# Patient Record
Sex: Female | Born: 1940 | Race: White | Hispanic: No | Marital: Married
Health system: Midwestern US, Academic
[De-identification: ages and names within clinical notes are randomized; demographics above are authoritative.]

---

## 2016-09-18 MED ORDER — SPIRONOLACTONE 25 MG PO TAB
ORAL_TABLET | Freq: Every day | ORAL | 11 refills | 46.00000 days | Status: DC
Start: 2016-09-18 — End: 2017-09-12

## 2016-11-18 ENCOUNTER — Encounter: Admit: 2016-11-18 | Discharge: 2016-11-18 | Payer: MEDICARE | Primary: Family

## 2016-11-18 DIAGNOSIS — I10 Essential (primary) hypertension: ICD-10-CM

## 2016-11-18 DIAGNOSIS — E785 Hyperlipidemia, unspecified: ICD-10-CM

## 2016-11-18 DIAGNOSIS — I447 Left bundle-branch block, unspecified: Principal | ICD-10-CM

## 2016-11-20 MED ORDER — AMLODIPINE 2.5 MG PO TAB
ORAL_TABLET | Freq: Every day | 3 refills | Status: AC
Start: 2016-11-20 — End: 2017-12-05

## 2017-01-15 ENCOUNTER — Encounter: Admit: 2017-01-15 | Discharge: 2017-01-15 | Payer: MEDICARE | Primary: Family

## 2017-01-15 DIAGNOSIS — E785 Hyperlipidemia, unspecified: ICD-10-CM

## 2017-01-15 DIAGNOSIS — I10 Essential (primary) hypertension: ICD-10-CM

## 2017-01-15 DIAGNOSIS — I447 Left bundle-branch block, unspecified: Principal | ICD-10-CM

## 2017-01-15 MED ORDER — ATORVASTATIN 40 MG PO TAB
ORAL_TABLET | Freq: Every day | 3 refills | Status: AC
Start: 2017-01-15 — End: 2018-01-09

## 2017-01-27 ENCOUNTER — Encounter: Admit: 2017-01-27 | Discharge: 2017-01-27 | Payer: MEDICARE | Primary: Family

## 2017-01-30 MED ORDER — POTASSIUM CHLORIDE 10 MEQ PO TBER
ORAL_TABLET | Freq: Every day | 7 refills | 30.00000 days | Status: AC
Start: 2017-01-30 — End: 2017-07-23

## 2017-02-02 ENCOUNTER — Encounter: Admit: 2017-02-02 | Discharge: 2017-02-02 | Payer: MEDICARE | Primary: Family

## 2017-02-02 MED ORDER — HYDROCHLOROTHIAZIDE 50 MG PO TAB
ORAL_TABLET | Freq: Every day | ORAL | 3 refills | 28.00000 days | Status: AC
Start: 2017-02-02 — End: 2018-01-15

## 2017-06-19 ENCOUNTER — Ambulatory Visit: Admit: 2017-06-19 | Discharge: 2017-06-20 | Payer: MEDICARE | Primary: Family

## 2017-06-19 ENCOUNTER — Encounter: Admit: 2017-06-19 | Discharge: 2017-06-19 | Payer: MEDICARE | Primary: Family

## 2017-06-19 DIAGNOSIS — I10 Essential (primary) hypertension: Principal | ICD-10-CM

## 2017-06-19 DIAGNOSIS — I2510 Atherosclerotic heart disease of native coronary artery without angina pectoris: ICD-10-CM

## 2017-06-19 DIAGNOSIS — K8000 Calculus of gallbladder with acute cholecystitis without obstruction: ICD-10-CM

## 2017-06-19 DIAGNOSIS — E079 Disorder of thyroid, unspecified: Principal | ICD-10-CM

## 2017-06-19 DIAGNOSIS — E039 Hypothyroidism, unspecified: ICD-10-CM

## 2017-06-19 DIAGNOSIS — E785 Hyperlipidemia, unspecified: ICD-10-CM

## 2017-06-19 DIAGNOSIS — K922 Gastrointestinal hemorrhage, unspecified: ICD-10-CM

## 2017-06-19 DIAGNOSIS — M4310 Spondylolisthesis, site unspecified: ICD-10-CM

## 2017-06-19 DIAGNOSIS — K219 Gastro-esophageal reflux disease without esophagitis: ICD-10-CM

## 2017-06-19 DIAGNOSIS — M549 Dorsalgia, unspecified: ICD-10-CM

## 2017-07-11 ENCOUNTER — Encounter: Admit: 2017-07-11 | Discharge: 2017-07-11 | Payer: MEDICARE | Primary: Family

## 2017-07-11 DIAGNOSIS — E785 Hyperlipidemia, unspecified: ICD-10-CM

## 2017-07-11 DIAGNOSIS — I10 Essential (primary) hypertension: Principal | ICD-10-CM

## 2017-07-11 LAB — BASIC METABOLIC PANEL
Lab: 137
Lab: 14
Lab: 0.7 /HPF (ref 0–2)
Lab: 101
Lab: 26
Lab: 3.6
Lab: 9.7

## 2017-07-23 ENCOUNTER — Encounter: Admit: 2017-07-23 | Discharge: 2017-07-23 | Payer: MEDICARE | Primary: Family

## 2017-07-23 MED ORDER — POTASSIUM CHLORIDE 10 MEQ PO TBER
ORAL_TABLET | 3 refills | 30.00000 days | Status: AC
Start: 2017-07-23 — End: 2017-07-23

## 2017-07-23 MED ORDER — POTASSIUM CHLORIDE 10 MEQ PO TBER
10 meq | ORAL_TABLET | Freq: Every day | ORAL | 3 refills | 30.00000 days | Status: AC
Start: 2017-07-23 — End: 2018-07-10

## 2017-07-24 ENCOUNTER — Encounter: Admit: 2017-07-24 | Discharge: 2017-07-24 | Payer: MEDICARE | Primary: Family

## 2017-07-24 MED ORDER — LOSARTAN 25 MG PO TAB
ORAL_TABLET | Freq: Every day | ORAL | 3 refills | 30.00000 days | Status: AC
Start: 2017-07-24 — End: 2018-07-10

## 2017-09-12 ENCOUNTER — Encounter: Admit: 2017-09-12 | Discharge: 2017-09-12 | Payer: MEDICARE | Primary: Family

## 2017-09-12 MED ORDER — SPIRONOLACTONE 25 MG PO TAB
ORAL_TABLET | Freq: Every day | ORAL | 11 refills | 46.00000 days | Status: AC
Start: 2017-09-12 — End: 2018-09-02

## 2017-12-05 ENCOUNTER — Encounter: Admit: 2017-12-05 | Discharge: 2017-12-05 | Payer: MEDICARE | Primary: Family

## 2017-12-05 DIAGNOSIS — E785 Hyperlipidemia, unspecified: ICD-10-CM

## 2017-12-05 DIAGNOSIS — I447 Left bundle-branch block, unspecified: Principal | ICD-10-CM

## 2017-12-05 DIAGNOSIS — I10 Essential (primary) hypertension: ICD-10-CM

## 2017-12-05 MED ORDER — AMLODIPINE 2.5 MG PO TAB
ORAL_TABLET | Freq: Every day | 3 refills | Status: AC
Start: 2017-12-05 — End: 2018-03-28

## 2018-01-09 ENCOUNTER — Encounter: Admit: 2018-01-09 | Discharge: 2018-01-09 | Payer: MEDICARE | Primary: Family

## 2018-01-09 DIAGNOSIS — I10 Essential (primary) hypertension: ICD-10-CM

## 2018-01-09 DIAGNOSIS — E785 Hyperlipidemia, unspecified: ICD-10-CM

## 2018-01-09 DIAGNOSIS — I447 Left bundle-branch block, unspecified: Principal | ICD-10-CM

## 2018-01-09 MED ORDER — ATORVASTATIN 40 MG PO TAB
ORAL_TABLET | Freq: Every day | 3 refills | Status: AC
Start: 2018-01-09 — End: 2019-01-06

## 2018-01-15 ENCOUNTER — Encounter: Admit: 2018-01-15 | Discharge: 2018-01-15 | Payer: MEDICARE | Primary: Family

## 2018-01-15 MED ORDER — HYDROCHLOROTHIAZIDE 50 MG PO TAB
ORAL_TABLET | Freq: Every day | ORAL | 1 refills | 28.00000 days | Status: AC
Start: 2018-01-15 — End: 2018-07-10

## 2018-01-23 LAB — LIPID PROFILE
Lab: 13 % (ref 0–2)
Lab: 130 % — ABNORMAL LOW (ref 150–200)
Lab: 3 10*3/uL (ref 1.0–4.8)
Lab: 45 % — ABNORMAL LOW (ref >60)
Lab: 65 % — ABNORMAL LOW (ref >60)
Lab: 76 % (ref 0–5)

## 2018-01-23 LAB — ALT (SGPT): Lab: 14 M/UL (ref 4.0–5.0)

## 2018-02-04 ENCOUNTER — Encounter: Admit: 2018-02-04 | Discharge: 2018-02-04 | Payer: MEDICARE | Primary: Family

## 2018-03-06 ENCOUNTER — Encounter: Admit: 2018-03-06 | Discharge: 2018-03-06 | Payer: MEDICARE | Primary: Family

## 2018-03-07 ENCOUNTER — Encounter: Admit: 2018-03-07 | Discharge: 2018-03-07 | Payer: MEDICARE | Primary: Family

## 2018-03-28 ENCOUNTER — Ambulatory Visit: Admit: 2018-03-28 | Discharge: 2018-03-29 | Payer: MEDICARE | Primary: Family

## 2018-03-28 ENCOUNTER — Encounter: Admit: 2018-03-28 | Discharge: 2018-03-28 | Payer: MEDICARE | Primary: Family

## 2018-03-28 DIAGNOSIS — E785 Hyperlipidemia, unspecified: ICD-10-CM

## 2018-03-28 DIAGNOSIS — E079 Disorder of thyroid, unspecified: Principal | ICD-10-CM

## 2018-03-28 DIAGNOSIS — I2510 Atherosclerotic heart disease of native coronary artery without angina pectoris: ICD-10-CM

## 2018-03-28 DIAGNOSIS — I447 Left bundle-branch block, unspecified: Principal | ICD-10-CM

## 2018-03-28 DIAGNOSIS — M549 Dorsalgia, unspecified: ICD-10-CM

## 2018-03-28 DIAGNOSIS — K8000 Calculus of gallbladder with acute cholecystitis without obstruction: ICD-10-CM

## 2018-03-28 DIAGNOSIS — I10 Essential (primary) hypertension: Secondary | ICD-10-CM

## 2018-03-28 DIAGNOSIS — K219 Gastro-esophageal reflux disease without esophagitis: ICD-10-CM

## 2018-03-28 DIAGNOSIS — K922 Gastrointestinal hemorrhage, unspecified: ICD-10-CM

## 2018-03-28 DIAGNOSIS — E039 Hypothyroidism, unspecified: ICD-10-CM

## 2018-03-28 DIAGNOSIS — M4310 Spondylolisthesis, site unspecified: ICD-10-CM

## 2018-03-28 MED ORDER — AMLODIPINE 2.5 MG PO TAB
2.5 mg | ORAL_TABLET | Freq: Two times a day (BID) | ORAL | 3 refills | Status: AC
Start: 2018-03-28 — End: 2018-03-29

## 2018-03-29 ENCOUNTER — Encounter: Admit: 2018-03-29 | Discharge: 2018-03-29 | Payer: MEDICARE | Primary: Family

## 2018-03-29 DIAGNOSIS — I10 Essential (primary) hypertension: ICD-10-CM

## 2018-03-29 DIAGNOSIS — E785 Hyperlipidemia, unspecified: ICD-10-CM

## 2018-03-29 DIAGNOSIS — I447 Left bundle-branch block, unspecified: Principal | ICD-10-CM

## 2018-03-29 MED ORDER — AMLODIPINE 2.5 MG PO TAB
2.5 mg | ORAL_TABLET | Freq: Two times a day (BID) | ORAL | 3 refills | Status: AC
Start: 2018-03-29 — End: 2019-03-27

## 2018-07-10 ENCOUNTER — Encounter: Admit: 2018-07-10 | Discharge: 2018-07-10 | Payer: MEDICARE | Primary: Family

## 2018-07-10 MED ORDER — HYDROCHLOROTHIAZIDE 50 MG PO TAB
ORAL_TABLET | Freq: Every day | ORAL | 3 refills | 28.00000 days | Status: AC
Start: 2018-07-10 — End: 2019-07-07

## 2018-07-10 MED ORDER — LOSARTAN 25 MG PO TAB
ORAL_TABLET | Freq: Every day | ORAL | 3 refills | 90.00000 days | Status: AC
Start: 2018-07-10 — End: 2019-06-23

## 2018-07-10 MED ORDER — POTASSIUM CHLORIDE 10 MEQ PO TBER
ORAL_TABLET | Freq: Every day | 3 refills | 30.00000 days | Status: AC
Start: 2018-07-10 — End: 2019-07-11

## 2018-08-09 ENCOUNTER — Encounter: Admit: 2018-08-09 | Discharge: 2018-08-09 | Payer: MEDICARE | Primary: Family

## 2018-08-29 ENCOUNTER — Encounter: Admit: 2018-08-29 | Discharge: 2018-08-29 | Payer: MEDICARE | Primary: Family

## 2018-09-01 ENCOUNTER — Encounter: Admit: 2018-09-01 | Discharge: 2018-09-01 | Payer: MEDICARE | Primary: Family

## 2018-09-02 MED ORDER — SPIRONOLACTONE 25 MG PO TAB
ORAL_TABLET | Freq: Every day | ORAL | 11 refills | 90.00000 days | Status: DC
Start: 2018-09-02 — End: 2019-09-05

## 2018-12-17 ENCOUNTER — Encounter: Admit: 2018-12-17 | Discharge: 2018-12-17 | Primary: Family

## 2018-12-17 ENCOUNTER — Ambulatory Visit: Admit: 2018-12-17 | Discharge: 2018-12-18 | Primary: Family

## 2018-12-17 DIAGNOSIS — E079 Disorder of thyroid, unspecified: Secondary | ICD-10-CM

## 2018-12-17 DIAGNOSIS — K219 Gastro-esophageal reflux disease without esophagitis: Secondary | ICD-10-CM

## 2018-12-17 DIAGNOSIS — I10 Essential (primary) hypertension: Secondary | ICD-10-CM

## 2018-12-17 DIAGNOSIS — E039 Hypothyroidism, unspecified: Secondary | ICD-10-CM

## 2018-12-17 DIAGNOSIS — E785 Hyperlipidemia, unspecified: Secondary | ICD-10-CM

## 2018-12-17 DIAGNOSIS — M549 Dorsalgia, unspecified: Secondary | ICD-10-CM

## 2018-12-17 DIAGNOSIS — I2510 Atherosclerotic heart disease of native coronary artery without angina pectoris: Secondary | ICD-10-CM

## 2018-12-17 DIAGNOSIS — M4310 Spondylolisthesis, site unspecified: Secondary | ICD-10-CM

## 2018-12-17 DIAGNOSIS — K922 Gastrointestinal hemorrhage, unspecified: Secondary | ICD-10-CM

## 2018-12-17 DIAGNOSIS — K8000 Calculus of gallbladder with acute cholecystitis without obstruction: Secondary | ICD-10-CM

## 2018-12-17 NOTE — Progress Notes
Date of Service: 12/17/2018    Erika Payne is a 78 y.o. female.       HPI     [name] is followed for hypertension and hyperlipidemia and for chronic left bundle branch block on her ECG.??????  Her blood pressures recently been very well controlled with the addition of spironolactone to her medical regimen.  She reports no infectious contacts or infectious symptoms. Otherwise, over the past 6???months???she has been stable from a cardiovascular perspective. The patient reports no angina, congestive symptoms, palpitations, sensation of sustained forceful heart pounding, lightheadedness or syncope. Her exercise tolerance has been stable.?????????She performs video exercises 3 days a week for 30 minutes and rides her stationary bicycle for 20 minutes twice a week. The patient reports no myalgias, bleeding abnormalities, focal neurologic motor abnormalities or difficulty with speech. She reports no hospitalizations or emergency room visits.  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  ???  Histroically, Erika Payne was admitted on 03/15/12 following a 2 day history of weakness, abdominal discomfort, nausea, and dyspnea. She was hypotensive in the ED, passed a melanotic stool, and was found to be anemic. During stay at [facility], received a total of 3 units of blood. She was transferred to [facility] and EGD was performed on 03/18/12 revealing minimal fresh blood in (the duodenal) bulb. A circumferential ulcer at D1/D2 junction with mild narrowing of the lumen. The ulcer was carefully examined and was found to be clean base with no high risk stigmata for recurrent bleeding were seen. No endoscopic intervention was performed. Erika Payne was placed on pantoprazole twice daily and was discharged on 03/24/12.??? She fell down the stairs on 07/25/14 and was hospitalized on the trauma service at [facility] from 07/25/14 through 07/28/14 where she was monitored for a subdural hemorrhage and hematoma. Intervention was not required and she recovered well.       Vitals:    12/17/18 0822 12/17/18 0832   BP: 128/76 126/74   BP Source: Arm, Left Upper Arm, Right Upper   Pulse: 68    Temp: 36.8 ???C (98.2 ???F)    SpO2: 97%    Weight: 70.8 kg (156 lb)    Height: 1.676 m (5' 6)    PainSc: Zero      Body mass index is 25.18 kg/m???.     Past Medical History  Patient Active Problem List    Diagnosis Date Noted   ??? SDH (subdural hematoma) (HCC) 07/26/2014   ??? Fall (on) (from) other stairs and steps, initial encounter 07/26/2014   ??? Syncope 07/25/2014   ??? Left bundle branch block 03/28/2012   ??? Hypokalemia 03/20/2012   ??? 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 Acute blood loss anemia 03/20/2012   ??? Metabolic acidosis 03/20/2012   ??? GI bleed 03/17/2012   ??? Observation for suspected coronary artery disease (CAD) 11/04/2008     Cardiac surveillance testing           a. 11/02 - Exercise echo doppler: EF 60%, trace-mild MR and TR, >95mm ST               segment depression in the infero-lateral leads, that became upsloping within               a minute into recovery     ??? Hypertension 11/04/2008     a. 11/97-Echo:EF 60%, mild LVH, diastolic dysfunction     ???  Hyperlipidemia 11/04/2008     Hypercholesterolemia           a. 10/01-Total 158, Trig 127, HDL 51, LDL 82-Lipitor 20mg      ??? IBS (irritable bowel syndrome) 11/04/2008   ??? 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 Dizziness 11/04/2008         Review of Systems   Constitution: Negative.   HENT: Negative.    Eyes: Negative.    Cardiovascular: Negative.    Respiratory: Negative.    Endocrine: Negative.    Hematologic/Lymphatic: Bruises/bleeds easily.   Skin: Negative.    Musculoskeletal: Positive for arthritis and back pain.   Gastrointestinal: Negative.    Genitourinary: Negative.    Neurological: Negative.    Psychiatric/Behavioral: Negative. Allergic/Immunologic: Negative.        Physical Exam  HEENT: No abnormalities of the visible oro-nasopharynx, conjunctiva or sclera are noted. ???  NECK: There is no jugular venous distension. Carotids are palpable and without bruits. There is no thyroid enlargement. ???  Chest: Lung fields are clear to auscultation. There are no wheezes or crackles. ???  CV: There is a regular rhythm. The first and second heart sounds are normal. A grade 2-3/6 systolic ejection murmur is heard best with the patient supine.There are no diastolic murmurs, gallops or rubs. Her apical heart rate is 68???BPM.  ABD: The abdomen is soft and supple with normal bowel sounds. There is no hepatosplenomegaly, ascites, tenderness, masses or bruits. ???  Neuro: There are no focal motor defects. Ambulation is normal. Cognitive function appears normal. ???  Ext:???There is no edema or evidence of deep vein thrombosis. Peripheral pulses are satisfactory. ???  SKIN:  A rash is noted mostly on her extremities.    PSYCH:???The patient is calm, rationale and oriented.    Cardiovascular Studies  A twelve-lead ECG was obtained on 03/28/2018 reveals normal sinus rhythm with a heart rate of 55 bpm.  Left bundle branch block is noted.  Echo Doppler 06/10/2012:  Technically limted study  Overall LV systolic function is normal, estimated LV EF 50-55%  LV basal septal hypertrophy  Abnormal septal motion consistent with left bundle branch block   Normal chamber dimensions  Aortic sclerosis without aortic stenosis  Calcified aortic root adjacent to left atrium  Mild mitral annular calcification  Mild mitral and trace tricuspid and pulmonic regurgitation  Tricuspid regurgitation peak velocity not well defined and right ventricular peak systolic pressure may be underestimated. The estimated right ventricular peak systolic pressure is similar to prior study 06/12/2011.      Problems Addressed Today  Essential hypertension.  Left bundle branch block.  Hypercholesterolemia. Assessment and Plan     Erika Payne currently appears quite stable from a cardiovascular perspective.  Her blood pressure appears to be under very good control.  I have asked her to obtain a Chem-7 to check her potassium and renal function since she is taken spironolactone. I have asked the patient to keep a log book of her BP readings and to report systolic BP readings exceeding 140 mm Hg.  Regular mild aerobic exercise, weight loss and adherence to a heart healthy diet were recommended. I have asked her to return for follow-up in 6 months.         Current Medications (including today's revisions)  ??? amLODIPine (NORVASC) 2.5 mg tablet Take one tablet by mouth twice daily.   ??? aspirin EC 81 mg tablet Take 1 Tab by mouth  daily.   ??? atorvastatin (LIPITOR) 40 mg tablet TAKE 1 TABLET BY MOUTH ONCE DAILY   ??? citalopram (CELEXA) 10 mg tablet Take 1 tablet by mouth daily.   ??? hydroCHLOROthiazide (HYDRODIURIL) 50 mg tablet TAKE 1/2 (ONE-HALF) TABLET BY MOUTH ONCE DAILY   ??? L.acid/L.casei/B.bif/B.lon/FOS (PROBIOTIC BLEND PO) Take  by mouth.   ??? levothyroxine (SYNTHROID) 125 mcg tablet Take 125 mcg by mouth daily.   ??? loratadine (CLARITIN) 10 mg tablet Take 10 mg by mouth every morning.   ??? losartan (COZAAR) 25 mg tablet TAKE 1 TABLET BY MOUTH ONCE DAILY   ??? LUMIGAN 0.01 % drop INSTILL 1 DROP INTO EACH EYE EVERY DAY AT BEDTIME   ??? pantoprazole DR (PROTONIX) 40 mg tablet Take 40 mg by mouth daily.   ??? potassium chloride (K-DUR) 10 mEq tablet TAKE 1 TABLET BY MOUTH ONCE DAILY WITH MEAL AND  FULL  GLASS  OF  WATER   ??? spironolactone (ALDACTONE) 25 mg tablet Take 1 tablet by mouth once daily with food   ??? temazepam (RESTORIL) 15 mg capsule Take 15-30 mg by mouth at bedtime as needed.   ??? traMADol/acetaminophen(+) (ULTRACET) 37.5/325 mg tablet Take 1-2 Tabs by mouth every 4 hours as needed for Pain.   ??? VIT C/VIT E AC/LUT/COPPER/ZINC (PRESERVISION LUTEIN PO) Take 2 Tabs by mouth.

## 2018-12-30 ENCOUNTER — Encounter: Admit: 2018-12-30 | Discharge: 2018-12-30 | Primary: Family

## 2019-01-04 ENCOUNTER — Encounter: Admit: 2019-01-04 | Discharge: 2019-01-04 | Primary: Family

## 2019-01-04 DIAGNOSIS — I447 Left bundle-branch block, unspecified: Secondary | ICD-10-CM

## 2019-01-04 DIAGNOSIS — E785 Hyperlipidemia, unspecified: Secondary | ICD-10-CM

## 2019-01-04 DIAGNOSIS — I10 Essential (primary) hypertension: Secondary | ICD-10-CM

## 2019-01-06 MED ORDER — ATORVASTATIN 40 MG PO TAB
ORAL_TABLET | Freq: Every day | 3 refills | Status: DC
Start: 2019-01-06 — End: 2019-12-29

## 2019-03-27 ENCOUNTER — Encounter: Admit: 2019-03-27 | Discharge: 2019-03-27 | Payer: MEDICARE | Primary: Family

## 2019-03-27 DIAGNOSIS — E785 Hyperlipidemia, unspecified: Secondary | ICD-10-CM

## 2019-03-27 DIAGNOSIS — I447 Left bundle-branch block, unspecified: Secondary | ICD-10-CM

## 2019-03-27 DIAGNOSIS — I10 Essential (primary) hypertension: Secondary | ICD-10-CM

## 2019-03-27 MED ORDER — AMLODIPINE 2.5 MG PO TAB
ORAL_TABLET | Freq: Two times a day (BID) | 3 refills | Status: AC
Start: 2019-03-27 — End: ?

## 2019-06-17 ENCOUNTER — Encounter: Admit: 2019-06-17 | Discharge: 2019-06-17 | Payer: MEDICARE | Primary: Family

## 2019-06-17 DIAGNOSIS — I2510 Atherosclerotic heart disease of native coronary artery without angina pectoris: Secondary | ICD-10-CM

## 2019-06-17 DIAGNOSIS — E785 Hyperlipidemia, unspecified: Secondary | ICD-10-CM

## 2019-06-17 DIAGNOSIS — M4310 Spondylolisthesis, site unspecified: Secondary | ICD-10-CM

## 2019-06-17 DIAGNOSIS — I447 Left bundle-branch block, unspecified: Secondary | ICD-10-CM

## 2019-06-17 DIAGNOSIS — M549 Dorsalgia, unspecified: Secondary | ICD-10-CM

## 2019-06-17 DIAGNOSIS — E039 Hypothyroidism, unspecified: Secondary | ICD-10-CM

## 2019-06-17 DIAGNOSIS — K8000 Calculus of gallbladder with acute cholecystitis without obstruction: Secondary | ICD-10-CM

## 2019-06-17 DIAGNOSIS — K219 Gastro-esophageal reflux disease without esophagitis: Secondary | ICD-10-CM

## 2019-06-17 DIAGNOSIS — K922 Gastrointestinal hemorrhage, unspecified: Secondary | ICD-10-CM

## 2019-06-17 DIAGNOSIS — I10 Essential (primary) hypertension: Secondary | ICD-10-CM

## 2019-06-17 DIAGNOSIS — E079 Disorder of thyroid, unspecified: Secondary | ICD-10-CM

## 2019-06-17 LAB — BASIC METABOLIC PANEL
Lab: 0.8
Lab: 11
Lab: 140
Lab: 15 — ABNORMAL HIGH (ref 0–14)
Lab: 28
Lab: 3.8
Lab: 68
Lab: 9.4
Lab: 98

## 2019-06-17 NOTE — Progress Notes
Date of Service: 06/17/2019    Erika Payne is a 79 y.o. female.       HPI     Erika Payne is followed for hypertension and hyperlipidemia and for chronic left bundle branch block on her ECG.????Her blood pressures recently been very well controlled with the addition of spironolactone to her medical regimen.  She reports no infectious contacts or infectious symptoms during the Covid pandemic.?Otherwise, over the past 6?months?she has been stable from a cardiovascular perspective. The patient reports no angina, congestive symptoms, palpitations, sensation of sustained forceful heart pounding, lightheadedness or syncope. Her exercise tolerance has been stable.???She performs video exercise ([employer]) every day  30 minutes and rides her stationary bicycle occasionally for exercise. The patient reports no myalgias, bleeding abnormalities, focal neurologic motor abnormalities or difficulty with speech. She reports no hospitalizations or emergency room visits. 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  ? Histroically, Erika. Ahlgren was admitted on 03/15/12 following a 2 day history of weakness, abdominal discomfort, nausea, and dyspnea. She was hypotensive in the ED, passed a melanotic stool, and was found to be anemic. During stay at [facility], received a total of 3 units of blood. She was transferred to [facility] and EGD was performed on 03/18/12 revealing minimal fresh blood in (the duodenal) bulb. A circumferential ulcer at D1/D2 junction with mild narrowing of the lumen. The ulcer was carefully examined and was found to be clean base with no high risk stigmata for recurrent bleeding were seen. No endoscopic intervention was performed. Erika. Segers was placed on pantoprazole twice daily and was discharged on 03/24/12.? She fell down the stairs on 07/25/14 and was hospitalized on the trauma service at [facility] from 07/25/14 through 07/28/14 where she was monitored for a subdural hemorrhage and hematoma. Intervention was not required and she recovered well.       Vitals:    06/17/19 1255 06/17/19 1308   BP: 132/70 126/70   BP Source: Arm, Left Upper Arm, Right Upper   Patient Position: Sitting Sitting   Pulse: 65    Temp: 36.8 ?C (98.3 ?F)    TempSrc: Oral    SpO2: 98%    Weight: 70.3 kg (155 lb)    Height: 1.676 m (5' 6)    PainSc: Zero      Body mass index is 25.02 kg/m?[name]     Past Medical History  Patient Active Problem List    Diagnosis Date Noted   ? SDH (subdural hematoma) (HCC) 07/26/2014   ? Fall (on) (from) other stairs and steps, initial encounter 07/26/2014   ? Syncope 07/25/2014   ? Left bundle branch block 03/28/2012   ? Hypokalemia 03/20/2012   ? 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 Acute blood loss anemia 03/20/2012   ? Metabolic acidosis 03/20/2012   ? GI bleed 03/17/2012   ? Observation for suspected coronary artery disease (CAD) 11/04/2008     Cardiac surveillance testing           a. 11/02 - Exercise echo doppler: EF 60%, trace-mild MR and TR, >25mm ST segment depression in the infero-lateral leads, that became upsloping within               a minute into recovery     ? Hypertension 11/04/2008     a. 11/97-Echo:EF 60%, mild LVH, diastolic dysfunction     ? Hyperlipidemia 11/04/2008  Hypercholesterolemia           a. 10/01-Total 158, Trig 127, HDL 51, LDL 82-Lipitor 20mg      ? IBS (irritable bowel syndrome) 11/04/2008   ? 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 Dizziness 11/04/2008         Review of Systems   Constitution: Negative.   HENT: Negative.    Eyes: Negative.    Cardiovascular: Negative.    Respiratory: Negative.    Endocrine: Negative.    Hematologic/Lymphatic: Negative.    Skin: Negative.    Musculoskeletal: Negative.    Gastrointestinal: Negative.    Genitourinary: Negative.    Neurological: Negative.    Psychiatric/Behavioral: Negative.    Allergic/Immunologic: Negative.        Physical Exam  HEENT: No abnormalities of the visible oro-nasopharynx, conjunctiva or sclera are noted. ?  NECK: There is no jugular venous distension. Carotids are palpable and without bruits. There is no thyroid enlargement. ?  Chest: Lung fields are clear to auscultation. There are no wheezes or crackles. ?  CV: There is a regular rhythm. The first and second heart sounds are normal. A grade 2-3/6 systolic ejection murmur is heard best with the patient supine.There are no diastolic murmurs, gallops or rubs. Her apical heart rate is 64?BPM.  ABD: The abdomen is soft and supple with normal bowel sounds. There is no hepatosplenomegaly, ascites, tenderness, masses or bruits. ?  Neuro: There are no focal motor defects. Ambulation is normal. Cognitive function appears normal. ?  Ext:?There is no edema or evidence of deep vein thrombosis. Peripheral pulses are satisfactory. ?  SKIN:?No rashes or cellulitis.  PSYCH:?The patient is calm, rationale and oriented.    Cardiovascular Studies A twelve-lead ECG was obtained on 03/28/2018 reveals normal sinus rhythm with a heart rate of 55 bpm. ?Left bundle branch block is noted.  A twelve-lead ECG obtained on 06/17/2019 reveals normal sinus rhythm with a heart rate of 57 bpm.  Left bundle branch block is present.  Labs from 12/17/2018 revealed serum potassium 3.4 mmol/L and serum creatinine 0.89 mg/dL.  Her ALT = 17.  Her total cholesterol was 123, triglycerides 80, HDL 35 and LDL cholesterol 72 mg/dL.  Echo Doppler 06/10/2012:  Technically limted study  Overall LV systolic function is normal, estimated LV EF 50-55%  LV basal septal hypertrophy  Abnormal septal motion consistent with left bundle branch block   Normal chamber dimensions  Aortic sclerosis without aortic stenosis  Calcified aortic root adjacent to left atrium  Mild mitral annular calcification  Mild mitral and trace tricuspid and pulmonic regurgitation  Tricuspid regurgitation peak velocity not well defined and right ventricular peak systolic pressure may be underestimated. The estimated right ventricular peak systolic pressure is similar to prior study 06/12/2011.    Problems Addressed Today  Hypertension.  Hypercholesterolemia.  Left bundle branch block.  Assessment and Plan     Erika. Douse currently appears quite stable from a cardiovascular perspective.    She reports no angina or congestive symptoms.  Her blood pressure appears to be under very good control.  I have asked her to obtain a Chem-7 to check her potassium and renal function since she is taken spironolactone. I have asked the patient to keep a log book of her BP readings and to report BP readings exceeding 130/80 mm Hg.  Regular mild aerobic exercise, weight loss and adherence to a heart healthy diet were recommended. I have  asked her to return for follow-up in 6 months         Current Medications (including today's revisions)  ? amLODIPine (NORVASC) 2.5 mg tablet Take 1 tablet by mouth twice daily ? aspirin EC 81 mg tablet Take 1 Tab by mouth daily.   ? atorvastatin (LIPITOR) 40 mg tablet Take 1 tablet by mouth once daily   ? citalopram (CELEXA) 10 mg tablet Take 1 tablet by mouth daily.   ? ergocalciferol (vitamin D2) (VITAMIN D PO) Take 1 tablet by mouth daily.   ? hydroCHLOROthiazide (HYDRODIURIL) 50 mg tablet TAKE 1/2 (ONE-HALF) TABLET BY MOUTH ONCE DAILY   ? L.acid/L.casei/B.bif/B.lon/FOS (PROBIOTIC BLEND PO) Take  by mouth.   ? levothyroxine (SYNTHROID) 125 mcg tablet Take 125 mcg by mouth daily.   ? loratadine (CLARITIN) 10 mg tablet Take 10 mg by mouth every morning.   ? losartan (COZAAR) 25 mg tablet TAKE 1 TABLET BY MOUTH ONCE DAILY   ? LUMIGAN 0.01 % drop INSTILL 1 DROP INTO EACH EYE EVERY DAY AT BEDTIME   ? pantoprazole DR (PROTONIX) 40 mg tablet Take 40 mg by mouth daily.   ? potassium chloride (K-DUR) 10 mEq tablet TAKE 1 TABLET BY MOUTH ONCE DAILY WITH MEAL AND  FULL  GLASS  OF  WATER   ? spironolactone (ALDACTONE) 25 mg tablet Take 1 tablet by mouth once daily with food   ? temazepam (RESTORIL) 15 mg capsule Take 15-30 mg by mouth at bedtime as needed.   ? traMADol/acetaminophen(+) (ULTRACET) 37.5/325 mg tablet Take 1-2 Tabs by mouth every 4 hours as needed for Pain.   ? VIT C/VIT E AC/LUT/COPPER/ZINC (PRESERVISION LUTEIN PO) Take 2 Tabs by mouth.   ? vitamins, multiple cap Take 1 capsule by mouth daily.

## 2019-06-23 ENCOUNTER — Encounter: Admit: 2019-06-23 | Discharge: 2019-06-23 | Payer: MEDICARE | Primary: Family

## 2019-06-23 MED ORDER — LOSARTAN 25 MG PO TAB
ORAL_TABLET | Freq: Every day | ORAL | 3 refills | 90.00000 days | Status: AC
Start: 2019-06-23 — End: ?

## 2019-07-05 ENCOUNTER — Encounter: Admit: 2019-07-05 | Discharge: 2019-07-05 | Payer: MEDICARE | Primary: Family

## 2019-07-07 MED ORDER — HYDROCHLOROTHIAZIDE 50 MG PO TAB
ORAL_TABLET | Freq: Every day | ORAL | 3 refills | 28.00000 days | Status: AC
Start: 2019-07-07 — End: ?

## 2019-07-11 ENCOUNTER — Encounter: Admit: 2019-07-11 | Discharge: 2019-07-11 | Payer: MEDICARE | Primary: Family

## 2019-07-11 MED ORDER — POTASSIUM CHLORIDE 10 MEQ PO TBER
ORAL_TABLET | Freq: Every day | 3 refills | 30.00000 days | Status: AC
Start: 2019-07-11 — End: ?

## 2019-09-05 ENCOUNTER — Encounter: Admit: 2019-09-05 | Discharge: 2019-09-05 | Payer: MEDICARE | Primary: Family

## 2019-09-05 MED ORDER — SPIRONOLACTONE 25 MG PO TAB
ORAL_TABLET | Freq: Every day | ORAL | 11 refills | 90.00000 days | Status: AC
Start: 2019-09-05 — End: ?

## 2019-11-04 ENCOUNTER — Encounter: Admit: 2019-11-04 | Discharge: 2019-11-04 | Payer: MEDICARE | Primary: Family

## 2019-11-04 DIAGNOSIS — I2510 Atherosclerotic heart disease of native coronary artery without angina pectoris: Secondary | ICD-10-CM

## 2019-11-04 DIAGNOSIS — I10 Essential (primary) hypertension: Secondary | ICD-10-CM

## 2019-11-04 DIAGNOSIS — K922 Gastrointestinal hemorrhage, unspecified: Secondary | ICD-10-CM

## 2019-11-04 DIAGNOSIS — E079 Disorder of thyroid, unspecified: Secondary | ICD-10-CM

## 2019-11-04 DIAGNOSIS — K219 Gastro-esophageal reflux disease without esophagitis: Secondary | ICD-10-CM

## 2019-11-04 DIAGNOSIS — E785 Hyperlipidemia, unspecified: Secondary | ICD-10-CM

## 2019-11-04 DIAGNOSIS — M4310 Spondylolisthesis, site unspecified: Secondary | ICD-10-CM

## 2019-11-04 DIAGNOSIS — M549 Dorsalgia, unspecified: Secondary | ICD-10-CM

## 2019-11-04 DIAGNOSIS — E039 Hypothyroidism, unspecified: Secondary | ICD-10-CM

## 2019-11-04 DIAGNOSIS — K8000 Calculus of gallbladder with acute cholecystitis without obstruction: Secondary | ICD-10-CM

## 2019-11-04 NOTE — Progress Notes
Date of Service: 11/04/2019    Erika Payne is a 79 y.o. female.       HPI     Erika Payne is followed for hypertension and hyperlipidemia and for chronic left bundle branch block on her ECG.????Her blood pressures recently been?very well controlled with the addition of spironolactone to her medical regimen. ?She reports no infectious contacts or infectious symptoms during the Covid pandemic.? She has received both of her Moderna Covid vaccines.  Otherwise, over the past 6?months?she has been stable from a cardiovascular perspective. The patient reports no angina, congestive symptoms, palpitations, sensation of sustained forceful heart pounding, lightheadedness or syncope. Her exercise tolerance has been stable.???She?performs video exercise ([employer]) every day for30 minutes walks half a mile twice a day for exercise.?The patient reports no myalgias, bleeding abnormalities, focal neurologic motor abnormalities or difficulty with speech. 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  ?  Histroically, Erika Payne was admitted on 03/15/12 following a 2 day history of weakness, abdominal discomfort, nausea, and dyspnea. She was hypotensive in the ED, passed a melanotic stool, and was found to be anemic. During stay at [facility], received a total of 3 units of blood. She was transferred to [facility] and EGD was performed on 03/18/12 revealing minimal fresh blood in (the duodenal) bulb. A circumferential ulcer at D1/D2 junction with mild narrowing of the lumen. The ulcer was carefully examined and was found to be clean base with no high risk stigmata for recurrent bleeding were seen. No endoscopic intervention was performed. Erika Payne was placed on pantoprazole twice daily and was discharged on 03/24/12.? She fell down the stairs on 07/25/14 and was hospitalized on the trauma service at [facility] from 07/25/14 through 07/28/14 where she was monitored for a subdural hemorrhage and hematoma. Intervention was not required and she recovered well.       Vitals:    11/04/19 1048 11/04/19 1059   BP: 116/78 114/74   BP Source: Arm, Left Upper Arm, Right Upper   Patient Position: Sitting Sitting   Pulse: 72    SpO2: 97%    Weight: 72.2 kg (159 lb 3.2 oz)    Height: 1.676 m (5' 6)    PainSc: Zero      Body mass index is 25.7 kg/m?[name]     Past Medical History  Patient Active Problem List    Diagnosis Date Noted   ? SDH (subdural hematoma) (HCC) 07/26/2014   ? Fall (on) (from) other stairs and steps, initial encounter 07/26/2014   ? Syncope 07/25/2014   ? Left bundle branch block 03/28/2012   ? Hypokalemia 03/20/2012   ? 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 Acute blood loss anemia 03/20/2012   ? Metabolic acidosis 03/20/2012   ? GI bleed 03/17/2012   ? Observation for suspected coronary artery disease (CAD) 11/04/2008     Cardiac surveillance testing           a. 11/02 - Exercise echo doppler: EF 60%, trace-mild MR and TR, >89mm ST               segment depression in the infero-lateral leads, that became upsloping within               a minute into recovery     ? Hypertension 11/04/2008     a. 11/97-Echo:EF 60%, mild LVH, diastolic  dysfunction     ? Hyperlipidemia 11/04/2008     Hypercholesterolemia           a. 10/01-Total 158, Trig 127, HDL 51, LDL 82-Lipitor 20mg      ? IBS (irritable bowel syndrome) 11/04/2008   ? 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 Dizziness 11/04/2008         Review of Systems   Constitution: Negative.   HENT: Negative.    Eyes: Negative.    Cardiovascular: Negative.    Respiratory: Negative.    Endocrine: Negative.    Hematologic/Lymphatic: Negative.    Skin: Negative.    Musculoskeletal: Negative.    Gastrointestinal: Negative.    Genitourinary: Negative.    Neurological: Negative. Psychiatric/Behavioral: Negative.    Allergic/Immunologic: Negative.        Physical Exam  HEENT: No abnormalities of the visible oro-nasopharynx, conjunctiva or sclera are noted. ?  NECK: There is no jugular venous distension. Carotids are palpable and without bruits. There is no thyroid enlargement. ?  Chest: Lung fields are clear to auscultation. There are no wheezes or crackles. ?  CV: There is a regular rhythm. The first and second heart sounds are normal. A grade 2-3/6 systolic ejection murmur is heard best with the patient supine.There are no diastolic murmurs, gallops or rubs. Her apical heart rate is 60?BPM.  ABD: The abdomen is soft and supple with normal bowel sounds. There is no hepatosplenomegaly, ascites, tenderness, masses or bruits. ?  Neuro: There are no focal motor defects. Ambulation is normal. Cognitive function appears normal. ?  Ext:?There is no edema or evidence of deep vein thrombosis. Peripheral pulses are satisfactory. ?  SKIN:?No rashes or cellulitis.  PSYCH:?The patient is calm, rationale and oriented.    Cardiovascular Studies  A twelve-lead ECG was obtained on 03/28/2018 reveals normal sinus rhythm with a heart rate of 55 bpm. ?Left bundle branch block is noted.  A twelve-lead ECG obtained on 06/17/2019 reveals normal sinus rhythm with a heart rate of 57 bpm.  Left bundle branch block is present.  Labs from 12/17/2018 revealed serum potassium 3.4 mmol/L and serum creatinine 0.89 mg/dL.  Her ALT = 17.  Her total cholesterol was 123, triglycerides 80, HDL 35 and LDL cholesterol 72 mg/dL.  From 06/17/2019 revealed serum potassium 3.8 mmol/L and serum creatinine 0.85 mg/dL.  Echo Doppler 06/10/2012:  Technically limted study  Overall LV systolic function is normal, estimated LV EF 50-55%  LV basal septal hypertrophy  Abnormal septal motion consistent with left bundle branch block   Normal chamber dimensions  Aortic sclerosis without aortic stenosis  Calcified aortic root adjacent to left atrium Mild mitral annular calcification  Mild mitral and trace tricuspid and pulmonic regurgitation  Tricuspid regurgitation peak velocity not well defined and right ventricular peak systolic pressure may be underestimated. The estimated right ventricular peak systolic pressure is similar to prior study 06/12/2011.    Problems Addressed Today  Encounter Diagnoses   Name Primary?   ? Essential hypertension Yes       Assessment and Plan     Erika Payne?currently appears quite stable from a cardiovascular perspective. ?  She reports no angina or congestive symptoms.  Her blood pressure appears to be under very good control. ?I have asked her to obtain a Chem-7 to check her potassium and renal function since she is taken spironolactone. I have asked the patient to keep a log book of her?BP readings  and to report BP readings exceeding 130/80 mm Hg. ?Regular?mild?aerobic exercise, weight loss and adherence to a heart healthy diet were recommended. I have asked her to return for follow-up in?6?months         Current Medications (including today's revisions)  ? amLODIPine (NORVASC) 2.5 mg tablet Take 1 tablet by mouth twice daily   ? aspirin EC 81 mg tablet Take 1 Tab by mouth daily.   ? atorvastatin (LIPITOR) 40 mg tablet Take 1 tablet by mouth once daily   ? citalopram (CELEXA) 10 mg tablet Take 1 tablet by mouth daily.   ? ergocalciferol (vitamin D2) (VITAMIN D PO) Take 1 tablet by mouth daily.   ? hydroCHLOROthiazide (HYDRODIURIL) 50 mg tablet Take 1/2 (one-half) tablet by mouth once daily   ? levothyroxine (SYNTHROID) 125 mcg tablet Take 125 mcg by mouth daily.   ? losartan (COZAAR) 25 mg tablet Take 1 tablet by mouth once daily   ? LUMIGAN 0.01 % drop Apply 1 drop to left eye as directed at bedtime daily.   ? pantoprazole DR (PROTONIX) 40 mg tablet Take 40 mg by mouth daily.   ? potassium chloride (KLOR-CON) 10 mEq tablet TAKE 1 TABLET BY MOUTH ONCE DAILY WITH A MEAL AND  A  FULL  GLASS  OF  WATER   ? spironolactone (ALDACTONE) 25 mg tablet Take 1 tablet by mouth once daily with food   ? temazepam (RESTORIL) 15 mg capsule Take 15-30 mg by mouth at bedtime as needed.   ? traMADol/acetaminophen(+) (ULTRACET) 37.5/325 mg tablet Take 1-2 Tabs by mouth every 4 hours as needed for Pain.   ? VIT C/VIT E AC/LUT/COPPER/ZINC (PRESERVISION LUTEIN PO) Take 2 tablets by mouth daily.   ? vitamins, multiple cap Take 1 capsule by mouth daily.

## 2019-12-11 ENCOUNTER — Encounter: Admit: 2019-12-11 | Discharge: 2019-12-11 | Payer: MEDICARE | Primary: Family

## 2019-12-11 DIAGNOSIS — I10 Essential (primary) hypertension: Secondary | ICD-10-CM

## 2019-12-11 NOTE — Telephone Encounter
Called and left message with patient regarding lab results. Left call back number for any questions comments or concerns.

## 2019-12-11 NOTE — Telephone Encounter
-----   Message from Hester Mates, MD sent at 12/11/2019 10:08 AM CDT -----  Adelina Mings: Labs look stable.  Please let her know.  Thanks.  SBG  ----- Message -----  From: Florene Route, RN  Sent: 12/11/2019   9:24 AM CDT  To: Hester Mates, MD    Labs for your review and recommendations. Order last OV due to being on Spironolactone. Thanks!

## 2019-12-29 ENCOUNTER — Encounter: Admit: 2019-12-29 | Discharge: 2019-12-29 | Payer: MEDICARE | Primary: Family

## 2019-12-29 DIAGNOSIS — I10 Essential (primary) hypertension: Secondary | ICD-10-CM

## 2019-12-29 DIAGNOSIS — I447 Left bundle-branch block, unspecified: Secondary | ICD-10-CM

## 2019-12-29 DIAGNOSIS — E785 Hyperlipidemia, unspecified: Secondary | ICD-10-CM

## 2019-12-29 MED ORDER — ATORVASTATIN 40 MG PO TAB
ORAL_TABLET | Freq: Every day | 3 refills | Status: AC
Start: 2019-12-29 — End: ?

## 2020-05-18 ENCOUNTER — Encounter: Admit: 2020-05-18 | Discharge: 2020-05-18 | Payer: MEDICARE | Primary: Family

## 2020-05-18 DIAGNOSIS — M4310 Spondylolisthesis, site unspecified: Secondary | ICD-10-CM

## 2020-05-18 DIAGNOSIS — I2510 Atherosclerotic heart disease of native coronary artery without angina pectoris: Secondary | ICD-10-CM

## 2020-05-18 DIAGNOSIS — M549 Dorsalgia, unspecified: Secondary | ICD-10-CM

## 2020-05-18 DIAGNOSIS — K8000 Calculus of gallbladder with acute cholecystitis without obstruction: Secondary | ICD-10-CM

## 2020-05-18 DIAGNOSIS — I10 Essential (primary) hypertension: Secondary | ICD-10-CM

## 2020-05-18 DIAGNOSIS — E785 Hyperlipidemia, unspecified: Secondary | ICD-10-CM

## 2020-05-18 DIAGNOSIS — E039 Hypothyroidism, unspecified: Secondary | ICD-10-CM

## 2020-05-18 DIAGNOSIS — R42 Dizziness and giddiness: Secondary | ICD-10-CM

## 2020-05-18 DIAGNOSIS — E079 Disorder of thyroid, unspecified: Secondary | ICD-10-CM

## 2020-05-18 DIAGNOSIS — K219 Gastro-esophageal reflux disease without esophagitis: Secondary | ICD-10-CM

## 2020-05-18 DIAGNOSIS — K922 Gastrointestinal hemorrhage, unspecified: Secondary | ICD-10-CM

## 2020-05-18 NOTE — Progress Notes
Date of Service: 05/18/2020    Erika Payne is a 79 y.o. female.       HPI     Erika Payne is followed for hypertension and hyperlipidemia and for chronic left bundle branch block on her ECG.????Her blood pressures recently been?very well controlled with the addition of spironolactone to her medical regimen. ?She reports no infectious contacts or infectious symptoms?during the Covid pandemic.? She has received both of her Moderna Covid vaccines + booster.  Otherwise, over the past 6?months?she has been stable from a cardiovascular perspective. The patient reports no angina, congestive symptoms, palpitations, sensation of sustained forceful heart pounding, lightheadedness or syncope. Her exercise tolerance has been stable.???She?performs video?exercise ([employer]) every day?for30 minutes walks half a mile twice a day for exercise when the weather is nice.?The patient reports no myalgias, bleeding abnormalities, or strokelike symptoms. 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  She is also receiving injections for macular degeneration.  ?  Histroically, Erika. Payne was admitted on 03/15/12 following a 2 day history of weakness, abdominal discomfort, nausea, and dyspnea. She was hypotensive in the ED, passed a melanotic stool, and was found to be anemic. During stay at [facility], received a total of 3 units of blood. She was transferred to [facility] and EGD was performed on 03/18/12 revealing minimal fresh blood in (the duodenal) bulb. A circumferential ulcer at D1/D2 junction with mild narrowing of the lumen. The ulcer was carefully examined and was found to be clean base with no high risk stigmata for recurrent bleeding were seen. No endoscopic intervention was performed. Erika Payne was placed on pantoprazole twice daily and was discharged on 03/24/12.? She fell down the stairs on 07/25/14 and was hospitalized on the trauma service at [facility] from 07/25/14 through 07/28/14 where she was monitored for a subdural hemorrhage and hematoma. Intervention was not required and she recovered well.         Vitals:    05/18/20 1245   BP: 128/70   BP Source: Arm, Left Upper   Patient Position: Sitting   Pulse: 67   SpO2: 94%   Weight: 71.7 kg (158 lb)   Height: 1.676 m (5' 6)   PainSc: Zero     Body mass index is 25.5 kg/m?[name]     Past Medical History  Patient Active Problem List    Diagnosis Date Noted   ? SDH (subdural hematoma) (HCC) 07/26/2014   ? Fall (on) (from) other stairs and steps, initial encounter 07/26/2014   ? Syncope 07/25/2014   ? Left bundle branch block 03/28/2012   ? Hypokalemia 03/20/2012   ? 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 Acute blood loss anemia 03/20/2012   ? Metabolic acidosis 03/20/2012   ? GI bleed 03/17/2012   ? Observation for suspected coronary artery disease (CAD) 11/04/2008     Cardiac surveillance testing           a. 11/02 - Exercise echo doppler: EF 60%, trace-mild MR and TR, >29mm ST               segment depression in the infero-lateral leads, that became upsloping within               a minute into recovery     ? Hypertension 11/04/2008     a. 11/97-Echo:EF 60%, mild LVH, diastolic dysfunction     ?  Hyperlipidemia 11/04/2008     Hypercholesterolemia           a. 10/01-Total 158, Trig 127, HDL 51, LDL 82-Lipitor 20mg      ? IBS (irritable bowel syndrome) 11/04/2008   ? 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 Dizziness 11/04/2008         Review of Systems   Constitutional: Negative.   HENT: Negative.    Eyes: Negative.    Cardiovascular: Negative.    Respiratory: Negative.    Endocrine: Negative.    Hematologic/Lymphatic: Negative.    Skin: Negative.    Musculoskeletal: Negative.    Gastrointestinal: Negative.    Genitourinary: Negative.    Neurological: Negative. Psychiatric/Behavioral: Negative.    Allergic/Immunologic: Negative.        Physical Exam  HEENT: No abnormalities of the visible oro-nasopharynx, conjunctiva or sclera are noted. ?  NECK: There is no jugular venous distension. Carotids are palpable and without bruits. There is no thyroid enlargement. ?  Chest: Lung fields are clear to auscultation. There are no wheezes or crackles. ?  CV: There is a regular rhythm. The first and second heart sounds are normal. A grade 2-3/6 systolic ejection murmur is heard best with the patient supine.There are no diastolic murmurs, gallops or rubs. Her apical heart rate is 64?BPM.  ABD: The abdomen is soft and supple with normal bowel sounds. There is no hepatosplenomegaly, ascites, tenderness, masses or bruits. ?  Neuro: There are no focal motor defects. Ambulation is normal. Cognitive function appears normal. ?  Ext:?There is no edema or evidence of deep vein thrombosis. Peripheral pulses are satisfactory. ?  SKIN:?No rashes or cellulitis.  PSYCH:?The patient is calm, rationale and oriented.    Cardiovascular Studies  A twelve-lead ECG was obtained on 03/28/2018 reveals normal sinus rhythm with a heart rate of 55 bpm. ?Left bundle branch block is noted.  A twelve-lead ECG obtained on 06/17/2019 reveals normal sinus rhythm with a heart rate of 57 bpm. ?Left bundle branch block is present.  Labs from 12/17/2018 revealed serum potassium 3.4 mmol/L and serum creatinine 0.89 mg/dL. ?Her ALT = 17. ?Her total cholesterol was 123, triglycerides 80, HDL 35 and LDL cholesterol 72 mg/dL.  From 06/17/2019 revealed serum potassium 3.8 mmol/L and serum creatinine 0.85 mg/dL.  Labs from 01/09/2020 revealed potassium 4.9 mmol/L and serum creatinine 0.87 mg/dL.  Her ALT = 32.  Her total cholesterol was 152, triglycerides 94, HDL 54 and LDL cholesterol 79 mg/dL.  Her TSH was 2.95 and her hemoglobin A1c was 5.1%.    Echo Doppler 06/10/2012:  Technically limted study  Overall LV systolic function is normal, estimated LV EF 50-55%  LV basal septal hypertrophy  Abnormal septal motion consistent with left bundle branch block   Normal chamber dimensions  Aortic sclerosis without aortic stenosis  Calcified aortic root adjacent to left atrium  Mild mitral annular calcification  Mild mitral and trace tricuspid and pulmonic regurgitation  Tricuspid regurgitation peak velocity not well defined and right ventricular peak systolic pressure may be underestimated. The estimated right ventricular peak systolic pressure is similar to prior study 06/12/2011.    Cardiovascular Health Factors  Vitals BP Readings from Last 3 Encounters:   05/18/20 128/70   11/04/19 114/74   06/17/19 126/70     Wt Readings from Last 3 Encounters:   05/18/20 71.7 kg (158 lb)   11/04/19 72.2 kg (159 lb 3.2 oz)   06/17/19 70.3  kg (155 lb)     BMI Readings from Last 3 Encounters:   05/18/20 25.50 kg/m?   11/04/19 25.70 kg/m?   06/17/19 25.02 kg/m?      Smoking Social History     Tobacco Use   Smoking Status Never Smoker   Smokeless Tobacco Never Used      Lipid Profile Cholesterol   Date Value Ref Range Status   12/17/2018 123  Final     HDL   Date Value Ref Range Status   12/17/2018 35 (L) >=40 Final     LDL   Date Value Ref Range Status   12/17/2018 72  Final     Triglycerides   Date Value Ref Range Status   12/17/2018 80  Final      Blood Sugar No results found for: HGBA1C  Glucose   Date Value Ref Range Status   12/10/2019 113 (H) 70 - 105 Final   06/17/2019 98  Final   12/17/2018 102  Final   12/07/2004 143 (H) 70 - 110 MG/DL Final   16/02/9603 72 70 - 110 MG/DL Final   54/01/8118 71 70 - 110 MG/DL Final     Glucose, POC   Date Value Ref Range Status   03/23/2012 121 (H) 70 - 100 MG/DL Final   14/78/2956 213 (H) 70 - 100 MG/DL Final   08/65/7846 92 70 - 100 MG/DL Final          Problems Addressed Today  No diagnosis found.    Assessment and Plan     Erika. Halbig reports that she has been stable from a cardiovascular perspective.  She reports no chest discomfort or congestive symptoms.  Her blood pressures been well controlled when checked at home.  I have asked her to stop taking potassium since she is on spironolactone.  I have asked her to repeat her Chem-7 in February 2022.  I believe that her heart murmur is gradually increasing slowly throughout the years and I have asked her to obtain an echo Doppler study to assess for valvular heart disease.  I have asked her to return for follow-up in 6 months time to follow her progress.         Current Medications (including today's revisions)  ? amLODIPine (NORVASC) 2.5 mg tablet Take 1 tablet by mouth twice daily   ? aspirin EC 81 mg tablet Take 1 Tab by mouth daily.   ? atorvastatin (LIPITOR) 40 mg tablet Take 1 tablet by mouth once daily   ? citalopram (CELEXA) 10 mg tablet Take 1 tablet by mouth daily.   ? ergocalciferol (vitamin D2) (VITAMIN D PO) Take 1 tablet by mouth daily.   ? hydroCHLOROthiazide (HYDRODIURIL) 50 mg tablet Take 1/2 (one-half) tablet by mouth once daily   ? levothyroxine (SYNTHROID) 125 mcg tablet Take 125 mcg by mouth daily.   ? losartan (COZAAR) 25 mg tablet Take 1 tablet by mouth once daily   ? LUMIGAN 0.01 % drop Apply 1 drop to left eye as directed at bedtime daily.   ? pantoprazole DR (PROTONIX) 40 mg tablet Take 40 mg by mouth daily.   ? potassium chloride (KLOR-CON) 10 mEq tablet TAKE 1 TABLET BY MOUTH ONCE DAILY WITH A MEAL AND  A  FULL  GLASS  OF  WATER   ? spironolactone (ALDACTONE) 25 mg tablet Take 1 tablet by mouth once daily with food   ? temazepam (RESTORIL) 15 mg capsule Take 15-30 mg by mouth at bedtime as  needed.   ? traMADol/acetaminophen(+) (ULTRACET) 37.5/325 mg tablet Take 1-2 Tabs by mouth every 4 hours as needed for Pain.   ? VIT C/VIT E AC/LUT/COPPER/ZINC (PRESERVISION LUTEIN PO) Take 2 tablets by mouth daily.   ? vitamins, multiple cap Take 1 capsule by mouth daily.

## 2020-05-18 NOTE — Patient Instructions
Echo same day Dr Arna Medici is reading  Labs in Feb  Follow up in 29mo     Follow up as directed.  Call sooner if issues.  Call the [place] nursing line at [phone].  Leave a detailed message for the nurse in [place] with how we can assist you and we will call you back.

## 2020-05-19 ENCOUNTER — Encounter: Admit: 2020-05-19 | Discharge: 2020-05-19 | Payer: MEDICARE | Primary: Family

## 2020-06-09 ENCOUNTER — Encounter: Admit: 2020-06-09 | Discharge: 2020-06-09 | Payer: MEDICARE | Primary: Family

## 2020-06-09 MED ORDER — LOSARTAN 25 MG PO TAB
ORAL_TABLET | Freq: Every day | ORAL | 3 refills | 30.00000 days | Status: AC
Start: 2020-06-09 — End: ?

## 2020-06-16 ENCOUNTER — Encounter: Admit: 2020-06-16 | Discharge: 2020-06-16 | Payer: MEDICARE | Primary: Family

## 2020-06-16 ENCOUNTER — Ambulatory Visit: Admit: 2020-06-16 | Discharge: 2020-06-16 | Payer: MEDICARE | Primary: Family

## 2020-06-16 DIAGNOSIS — I10 Essential (primary) hypertension: Secondary | ICD-10-CM

## 2020-06-16 NOTE — Telephone Encounter
-----   Message from Hester Mates, MD sent at 06/16/2020  2:59 PM CST -----  Erika Payne: No major abnormalities on her echo Doppler study.  Normal left ventricular systolic function.  She has aortic valve sclerosis but without any stenosis or regurgitation.  She has mild mitral valve regurgitation and mild to moderate tricuspid valve regurgitation which can be followed over time.  No action is required at this time.  Please let her know thanks.  SBG  ----- Message -----  From: Hester Mates, MD  Sent: 06/16/2020   2:59 PM CST  To: Hester Mates, MD

## 2020-06-16 NOTE — Telephone Encounter
Results and recommendations called to patient lmom requested call back if questions

## 2020-06-25 ENCOUNTER — Encounter: Admit: 2020-06-25 | Discharge: 2020-06-25 | Payer: MEDICARE | Primary: Family

## 2020-06-25 MED ORDER — POTASSIUM CHLORIDE 10 MEQ PO TBER
ORAL_TABLET | Freq: Every day | 3 refills | 30.00000 days | Status: AC
Start: 2020-06-25 — End: ?

## 2020-06-25 MED ORDER — HYDROCHLOROTHIAZIDE 50 MG PO TAB
ORAL_TABLET | Freq: Every day | ORAL | 3 refills | 28.00000 days | Status: AC
Start: 2020-06-25 — End: ?

## 2020-07-14 ENCOUNTER — Encounter

## 2020-07-14 DIAGNOSIS — I10 Essential (primary) hypertension: Secondary | ICD-10-CM

## 2020-07-14 LAB — BASIC METABOLIC PANEL
Lab: 0.8
Lab: 104
Lab: 141
Lab: 23
Lab: 3.8
Lab: 9 — ABNORMAL LOW (ref 9.8–20.1)
Lab: 9.7
Lab: 96

## 2020-08-24 ENCOUNTER — Encounter: Admit: 2020-08-24 | Discharge: 2020-08-24 | Payer: MEDICARE | Primary: Family

## 2020-08-24 DIAGNOSIS — E785 Hyperlipidemia, unspecified: Secondary | ICD-10-CM

## 2020-08-24 DIAGNOSIS — I10 Essential (primary) hypertension: Secondary | ICD-10-CM

## 2020-08-24 DIAGNOSIS — I447 Left bundle-branch block, unspecified: Secondary | ICD-10-CM

## 2020-08-24 MED ORDER — AMLODIPINE 2.5 MG PO TAB
ORAL_TABLET | Freq: Two times a day (BID) | 3 refills | Status: AC
Start: 2020-08-24 — End: ?

## 2020-09-10 ENCOUNTER — Encounter: Admit: 2020-09-10 | Discharge: 2020-09-10 | Payer: MEDICARE | Primary: Family

## 2020-09-10 MED ORDER — SPIRONOLACTONE 25 MG PO TAB
ORAL_TABLET | Freq: Every day | ORAL | 3 refills | 46.00000 days | Status: AC
Start: 2020-09-10 — End: ?

## 2020-11-11 ENCOUNTER — Encounter: Admit: 2020-11-11 | Discharge: 2020-11-11 | Payer: MEDICARE | Primary: Family

## 2020-11-11 DIAGNOSIS — I10 Essential (primary) hypertension: Secondary | ICD-10-CM

## 2020-11-11 DIAGNOSIS — I2510 Atherosclerotic heart disease of native coronary artery without angina pectoris: Secondary | ICD-10-CM

## 2020-11-11 DIAGNOSIS — E785 Hyperlipidemia, unspecified: Secondary | ICD-10-CM

## 2020-11-11 DIAGNOSIS — E079 Disorder of thyroid, unspecified: Secondary | ICD-10-CM

## 2020-11-11 DIAGNOSIS — K922 Gastrointestinal hemorrhage, unspecified: Secondary | ICD-10-CM

## 2020-11-11 DIAGNOSIS — K219 Gastro-esophageal reflux disease without esophagitis: Secondary | ICD-10-CM

## 2020-11-11 DIAGNOSIS — M549 Dorsalgia, unspecified: Secondary | ICD-10-CM

## 2020-11-11 DIAGNOSIS — R42 Dizziness and giddiness: Secondary | ICD-10-CM

## 2020-11-11 DIAGNOSIS — E039 Hypothyroidism, unspecified: Secondary | ICD-10-CM

## 2020-11-11 DIAGNOSIS — K8000 Calculus of gallbladder with acute cholecystitis without obstruction: Secondary | ICD-10-CM

## 2020-11-11 DIAGNOSIS — M4310 Spondylolisthesis, site unspecified: Secondary | ICD-10-CM

## 2020-11-11 NOTE — Patient Instructions
Stop norvasc Stop aspirin     Monitor bp x 2 wks call with readings    Follow up as directed.  Call sooner if issues.  Call the [place] nursing line at [phone].  Leave a detailed message for the nurse in [place] with how we can assist you and we will call you back.

## 2020-11-11 NOTE — Progress Notes
Date of Service: 11/11/2020    SALA DRANEY is a 80 y.o. female.       HPI    Ms Shewanda Cerf is followed for hypertension and hyperlipidemia and for chronic left bundle branch block on her ECG.??Over the past month her blood pressures have been low and she has been feeling fatigued and washed out. Otherwise, over the past 6?months?she has been stable from a cardiovascular perspective. The patient reports no angina, congestive symptoms, palpitations, sensation of sustained forceful heart pounding, lightheadedness or syncope. Her exercise tolerance has been stable.???She?performs video?exercise (Heart and?Soul) every day?for 30 minutes?walks half a mile twice a day?for exercise when the weather is nice.?The patient reports no myalgias, bleeding abnormalities, or strokelike symptoms. 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  She is also receiving injections for macular degeneration. She has received both of her?Moderna?Covid vaccines + booster.   ?  Histroically, Ms. Stormes was admitted on 03/15/12 following a 2 day history of weakness, abdominal discomfort, nausea, and dyspnea. She was hypotensive in the ED, passed a melanotic stool, and was found to be anemic. During stay at [facility], received a total of 3 units of blood. She was transferred to [facility] and EGD was performed on 03/18/12 revealing minimal fresh blood in (the duodenal) bulb. A circumferential ulcer at D1/D2 junction with mild narrowing of the lumen. The ulcer was carefully examined and was found to be clean base with no high risk stigmata for recurrent bleeding were seen. No endoscopic intervention was performed. Ms. Mcglothen was placed on pantoprazole twice daily and was discharged on 03/24/12.? She fell down the stairs on 07/25/14 and was hospitalized on the trauma service at [facility] from 07/25/14 through 07/28/14 where she was monitored for a subdural hemorrhage and hematoma. Intervention was not required and she recovered well.       Vitals:    11/11/20 1218   BP: 100/60   BP Source: Arm, Left Upper   Pulse: 52   SpO2: 98%   O2 Device: None (Room air)   PainSc: Zero   Weight: 69.4 kg (153 lb)   Height: 167.6 cm (5' 6)     Body mass index is 24.69 kg/m?[name]     Past Medical History  Patient Active Problem List    Diagnosis Date Noted   ? SDH (subdural hematoma) (HCC) 07/26/2014   ? Fall (on) (from) other stairs and steps, initial encounter 07/26/2014   ? Syncope 07/25/2014   ? Left bundle branch block 03/28/2012   ? Hypokalemia 03/20/2012   ? 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 Acute blood loss anemia 03/20/2012   ? Metabolic acidosis 03/20/2012   ? GI bleed 03/17/2012   ? Observation for suspected coronary artery disease (CAD) 11/04/2008     Cardiac surveillance testing           a. 11/02 - Exercise echo doppler: EF 60%, trace-mild MR and TR, >5mm ST               segment depression in the infero-lateral leads, that became upsloping within               a minute into recovery     ? Hypertension 11/04/2008     a. 11/97-Echo:EF 60%, mild LVH, diastolic dysfunction     ? Hyperlipidemia 11/04/2008     Hypercholesterolemia  a. 10/01-Total 158, Trig 127, HDL 51, LDL 82-Lipitor 20mg      ? IBS (irritable bowel syndrome) 11/04/2008   ? 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 Dizziness 11/04/2008         Review of Systems   Constitutional: Negative.   HENT: Negative.    Eyes: Negative.    Cardiovascular: Negative.    Respiratory: Negative.    Endocrine: Negative.    Hematologic/Lymphatic: Negative.    Skin: Negative.    Musculoskeletal: Negative.    Gastrointestinal: Negative.    Genitourinary: Negative.    Neurological: Positive for light-headedness.   Psychiatric/Behavioral: Negative.    Allergic/Immunologic: Negative.        Physical Exam  HEENT: No abnormalities of the visible oro-nasopharynx, conjunctiva or sclera are noted. ?  NECK: There is no jugular venous distension. Carotids are palpable and without bruits. There is no thyroid enlargement. ?  Chest: Lung fields are clear to auscultation. There are no wheezes or crackles. ?  CV: There is a regular rhythm. The first and second heart sounds are normal. A grade 2-3/6 systolic ejection murmur is heard best with the patient supine.There are no diastolic murmurs, gallops or rubs. Her apical heart rate is 60?BPM.  ABD: The abdomen is soft and supple with normal bowel sounds. There is no hepatosplenomegaly, ascites, tenderness, masses or bruits. ?  Neuro: There are no focal motor defects. Ambulation is normal. Cognitive function appears normal. ?  Ext:?There is no edema or evidence of deep vein thrombosis. Peripheral pulses are satisfactory. ?  SKIN:?No rashes or cellulitis.  PSYCH:?The patient is calm, rationale and oriented.    Cardiovascular Studies  A twelve-lead ECG was obtained on 11/11/2020 and reveals normal sinus rhythm with a heart rate of 59 bpm.  Left bundle branch block is noted.    Echo Doppler 06/16/2020:  Interpretation Summary    1. No regional wall motion abnormalities are seen. Overall LV systolic function appears normal. The estimated left ventricular ejection fraction is 60%.   2. Normal left ventricular diastolic function.   3. Right ventricular contractility appears normal.  4. Normal atrial chamber dimensions.  5. Aortic valve sclerosis without stenosis.  6. Mild mitral valve regurgitation and mild to moderate tricuspid valve regurgitation are noted by Doppler exam.  7. No pericardial effusion is seen.    Cardiovascular Health Factors  Vitals BP Readings from Last 3 Encounters:   11/11/20 100/60   06/16/20 (!) 145/75   05/18/20 128/70     Wt Readings from Last 3 Encounters:   11/11/20 69.4 kg (153 lb)   06/16/20 71.6 kg (157 lb 12.8 oz)   05/18/20 71.7 kg (158 lb)     BMI Readings from Last 3 Encounters:   11/11/20 24.69 kg/m?   06/16/20 25.47 kg/m?   05/18/20 25.50 kg/m?      Smoking Social History     Tobacco Use   Smoking Status Never Smoker   Smokeless Tobacco Never Used      Lipid Profile Cholesterol   Date Value Ref Range Status   01/09/2020 152  Final     HDL   Date Value Ref Range Status   01/09/2020 54  Final     LDL   Date Value Ref Range Status   01/09/2020 79  Final     Triglycerides   Date Value Ref Range Status   01/09/2020 94  Final  Blood Sugar Hemoglobin A1C   Date Value Ref Range Status   01/09/2020 5.1  Final     Glucose   Date Value Ref Range Status   07/14/2020 96  Final   01/09/2020 101  Final   12/10/2019 113 (H) 70 - 105 Final   12/07/2004 143 (H) 70 - 110 MG/DL Final   16/02/9603 72 70 - 110 MG/DL Final   54/01/8118 71 70 - 110 MG/DL Final     Glucose, POC   Date Value Ref Range Status   03/23/2012 121 (H) 70 - 100 MG/DL Final   14/78/2956 213 (H) 70 - 100 MG/DL Final   08/65/7846 92 70 - 100 MG/DL Final          Problems Addressed Today  Encounter Diagnoses   Name Primary?   ? Primary hypertension    ? Hyperlipidemia, unspecified hyperlipidemia type    ? Dizziness        Assessment and Plan     Ms. Boyko reports fatigue associated with mild hypotension.  We reviewed her antihypertensive medications at great length and she wanted to stop her amlodipine.  I have asked her to monitor her blood pressure readings and report them within 2 weeks time to see if other medications need to be discontinued such as losartan.  Her hydrochlorothiazide and spironolactone could also be contributing to her hypotension.  She has been taking aspirin for years for primary prevention.  We reviewed the risk and benefits associated with this and she wanted to stop her aspirin.  I have asked her to return for follow-up in 4 months time to review her blood pressure readings.         Current Medications (including today's revisions)  ? atorvastatin (LIPITOR) 40 mg tablet Take 1 tablet by mouth once daily   ? ergocalciferol (vitamin D2) (VITAMIN D PO) Take 1 tablet by mouth daily.   ? hydroCHLOROthiazide (HYDRODIURIL) 50 mg tablet Take 1/2 (one-half) tablet by mouth once daily   ? levothyroxine (SYNTHROID) 125 mcg tablet Take 125 mcg by mouth daily.   ? losartan (COZAAR) 25 mg tablet Take 1 tablet by mouth once daily   ? LUMIGAN 0.01 % drop Apply 1 drop to left eye as directed at bedtime daily.   ? pantoprazole DR (PROTONIX) 40 mg tablet Take 40 mg by mouth daily.   ? spironolactone (ALDACTONE) 25 mg tablet Take 1 tablet by mouth once daily with food   ? temazepam (RESTORIL) 15 mg capsule Take 15-30 mg by mouth at bedtime as needed.   ? traMADol/acetaminophen(+) (ULTRACET) 37.5/325 mg tablet Take 1-2 Tabs by mouth every 4 hours as needed for Pain.   ? VIT C/VIT E AC/LUT/COPPER/ZINC (PRESERVISION LUTEIN PO) Take 2 tablets by mouth daily.   ? vitamins, multiple cap Take 1 capsule by mouth daily.

## 2020-11-22 ENCOUNTER — Encounter: Admit: 2020-11-22 | Discharge: 2020-11-22 | Payer: MEDICARE | Primary: Family

## 2020-11-22 NOTE — Telephone Encounter
BP Readings after stopping amlodipine for hypotension.    6/19 - 80/74  6/20 - 115/80  6/21 - 126/88  6/22 - 136/83  6/23 - 122/77  6/24 - 129/95  6/25 - 122/84  6/26 - 133/71    HR is in the 70s.    Ada states she is feeling much better.  She has more energy.      Will route to Dr. Arna Medici for any additional recommenations.

## 2021-01-18 ENCOUNTER — Encounter: Admit: 2021-01-18 | Discharge: 2021-01-18 | Payer: MEDICARE | Primary: Family

## 2021-01-18 NOTE — Telephone Encounter
Hester Mates, MD   To all: Okay to stop losartan. If she continues to have symptomatic hypotension I would also stop her spironolactone. Thanks. [facility]

## 2021-01-18 NOTE — Telephone Encounter
Discussed with patient medication changes. Patient will monitor blood pressures for the next couple of weeks and report how she has been doing. Callback number provided to patient for further questions or concerns. Patient verbalizes understanding and agrees with plan of care.

## 2021-01-18 NOTE — Telephone Encounter
Erika Payne's office called nursing line to report they saw the patient today and she is having symptomatic hypotension. Patient's BP in the office was 96/60, she has been experiencing dizziness and fatigue. At her last office visit she was taken off of amlodipine due to hypotension. Patient is currently taking HCTZ 25 mg daily, losartan 25 mg once daily, and spironolactone 25 mg once daily.  Will route to Dr. Arna Medici for his review and recommendations.

## 2021-01-26 ENCOUNTER — Encounter: Admit: 2021-01-26 | Discharge: 2021-01-26 | Payer: MEDICARE | Primary: Family

## 2021-01-26 NOTE — Telephone Encounter
Patient called with readings after discontinuing losartan 25mg  daily due to on going hypotension.  Previous week she had discontinued norvasc.  She is currently taking HCTZ 25mg  daily and Aldactone 25mg  daily.  Readings were listed     132/74   119/67  139/93  137/77  147/70  141/82  She does not report any issues.    Routed To [facility]

## 2021-01-27 ENCOUNTER — Encounter: Admit: 2021-01-27 | Discharge: 2021-01-27 | Payer: MEDICARE | Primary: Family

## 2021-01-27 DIAGNOSIS — I10 Essential (primary) hypertension: Secondary | ICD-10-CM

## 2021-01-27 DIAGNOSIS — E785 Hyperlipidemia, unspecified: Secondary | ICD-10-CM

## 2021-01-27 DIAGNOSIS — I447 Left bundle-branch block, unspecified: Secondary | ICD-10-CM

## 2021-01-27 MED ORDER — ATORVASTATIN 40 MG PO TAB
ORAL_TABLET | Freq: Every day | 0 refills
Start: 2021-01-27 — End: ?

## 2021-01-28 ENCOUNTER — Encounter: Admit: 2021-01-28 | Discharge: 2021-01-28 | Payer: MEDICARE | Primary: Family

## 2021-01-28 DIAGNOSIS — I447 Left bundle-branch block, unspecified: Secondary | ICD-10-CM

## 2021-01-28 DIAGNOSIS — I10 Essential (primary) hypertension: Secondary | ICD-10-CM

## 2021-01-28 DIAGNOSIS — E785 Hyperlipidemia, unspecified: Secondary | ICD-10-CM

## 2021-01-28 MED ORDER — ATORVASTATIN 40 MG PO TAB
40 mg | ORAL_TABLET | Freq: Every day | ORAL | 3 refills | Status: AC
Start: 2021-01-28 — End: ?

## 2021-03-23 ENCOUNTER — Encounter: Admit: 2021-03-23 | Discharge: 2021-03-23 | Payer: MEDICARE | Primary: Family

## 2021-03-23 ENCOUNTER — Ambulatory Visit: Admit: 2021-03-23 | Discharge: 2021-03-23 | Payer: MEDICARE | Primary: Family

## 2021-03-23 DIAGNOSIS — R0602 Shortness of breath: Secondary | ICD-10-CM

## 2021-03-23 DIAGNOSIS — R030 Elevated blood-pressure reading, without diagnosis of hypertension: Secondary | ICD-10-CM

## 2021-03-23 DIAGNOSIS — R112 Nausea with vomiting, unspecified: Secondary | ICD-10-CM

## 2021-03-23 DIAGNOSIS — R55 Syncope and collapse: Secondary | ICD-10-CM

## 2021-03-23 DIAGNOSIS — R079 Chest pain, unspecified: Secondary | ICD-10-CM

## 2021-03-24 ENCOUNTER — Encounter: Admit: 2021-03-24 | Discharge: 2021-03-24 | Payer: MEDICARE | Primary: Family

## 2021-03-24 NOTE — Telephone Encounter
Patient called nursing line today to report that she recently went to [facility] and was admitted to the hospital. Patient states that prior to going to the ER she experienced a syncopal episode on the toilet. Patient states that [facility] admitted her to do some tests and they changed some of her medications.     Patient called because she wanted to make Dr. Wesley Blas office aware of what happened. Informed patient to keep her appointment with Dr. Arna Medici on 03/31/21. Patient was instructed to monitor HR, BP and any symptoms over the next week. Patient informed of signs and symptoms to watch for and when to seek medical treatment.     Patient verbalized understanding and has no further questions at this time.

## 2021-03-28 ENCOUNTER — Encounter: Admit: 2021-03-28 | Discharge: 2021-03-28 | Payer: MEDICARE | Primary: Family

## 2021-03-31 ENCOUNTER — Encounter: Admit: 2021-03-31 | Discharge: 2021-03-31 | Payer: MEDICARE | Primary: Family

## 2021-03-31 ENCOUNTER — Ambulatory Visit: Admit: 2021-03-31 | Discharge: 2021-04-01 | Payer: MEDICARE | Primary: Family

## 2021-03-31 DIAGNOSIS — K922 Gastrointestinal hemorrhage, unspecified: Secondary | ICD-10-CM

## 2021-03-31 DIAGNOSIS — M549 Dorsalgia, unspecified: Secondary | ICD-10-CM

## 2021-03-31 DIAGNOSIS — E785 Hyperlipidemia, unspecified: Secondary | ICD-10-CM

## 2021-03-31 DIAGNOSIS — E039 Hypothyroidism, unspecified: Secondary | ICD-10-CM

## 2021-03-31 DIAGNOSIS — I10 Essential (primary) hypertension: Secondary | ICD-10-CM

## 2021-03-31 DIAGNOSIS — K8000 Calculus of gallbladder with acute cholecystitis without obstruction: Secondary | ICD-10-CM

## 2021-03-31 DIAGNOSIS — K219 Gastro-esophageal reflux disease without esophagitis: Secondary | ICD-10-CM

## 2021-03-31 DIAGNOSIS — I2510 Atherosclerotic heart disease of native coronary artery without angina pectoris: Secondary | ICD-10-CM

## 2021-03-31 DIAGNOSIS — E079 Disorder of thyroid, unspecified: Secondary | ICD-10-CM

## 2021-03-31 DIAGNOSIS — M4310 Spondylolisthesis, site unspecified: Secondary | ICD-10-CM

## 2021-03-31 NOTE — Progress Notes
Date of Service: 03/31/2021    Erika Payne is a 80 y.o. female.       HPI     HPI    Erika Payne is followed for hypertension and hyperlipidemia and for chronic left bundle branch block on her ECG.??On March 21, 2021 she noticed a sharp pain horizontally below both rib margins.  March 22, 2021 in the morning she developed diarrhea, vomiting diaphoresis and felt profoundly weak and fell.  In retrospect she remembers having some urinary symptoms as well.  She was hospitalized and found to have a bad urinary tract infection and improved with antibiotic therapy.  Her troponin was very minimally elevated during the hospitalization associated with her urinary tract infection.  Following treatment for her urinary tract infection she feels well but still has some tiredness and fatigue especially in the evening.   Otherwise, over the past 5?months?she has been stable from a cardiovascular perspective. The patient reports no angina, congestive symptoms, palpitations, sensation of sustained forceful heart pounding, lightheadedness or syncope. Her exercise tolerance has been stable.???She?performs video?exercise (Heart and?Soul) every day?for 30 minutes?walks half a mile twice a day?for exercise?when the weather is nice.?The patient reports no myalgias, bleeding abnormalities,?or strokelike symptoms.  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 She has received both of her?Moderna?Covid vaccines?+ booster.   ?  Histroically, Erika Payne was admitted on 03/15/12 following a 2 day history of weakness, abdominal discomfort, nausea, and dyspnea. She was hypotensive in the ED, passed a melanotic stool, and was found to be anemic. During stay at [facility], received a total of 3 units of blood. She was transferred to [facility] and EGD was performed on 03/18/12 revealing minimal fresh blood in (the duodenal) bulb. A circumferential ulcer at D1/D2 junction with mild narrowing of the lumen. The ulcer was carefully examined and was found to be clean base with no high risk stigmata for recurrent bleeding were seen. No endoscopic intervention was performed. Erika Payne was placed on pantoprazole twice daily and was discharged on 03/24/12.? She fell down the stairs on 07/25/14 and was hospitalized on the trauma service at [facility] from 07/25/14 through 07/28/14 where she was monitored for a subdural hemorrhage and hematoma. Intervention was not required and she recovered well.         Vitals:    03/31/21 1000   BP: 128/78   BP Source: Arm, Left Upper   Pulse: 70   SpO2: 97%   O2 Device: None (Room air)   PainSc: Zero   Weight: 69.4 kg (153 lb)   Height: 167.6 cm (5' 6)     Body mass index is 24.69 kg/m?[name]     Past Medical History  Patient Active Problem List    Diagnosis Date Noted   ? SDH (subdural hematoma) 07/26/2014   ? Fall (on) (from) other stairs and steps, initial encounter 07/26/2014   ? Syncope 07/25/2014   ? Left bundle branch block 03/28/2012   ? Hypokalemia 03/20/2012   ? 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 Acute blood loss anemia 03/20/2012   ? Metabolic acidosis 03/20/2012   ? GI bleed 03/17/2012   ? Observation for suspected coronary artery disease (CAD) 11/04/2008     Cardiac surveillance testing           a. 11/02 - Exercise echo doppler: EF 60%, trace-mild MR and TR, >87mm ST  segment depression in the infero-lateral leads, that became upsloping within               a minute into recovery     ? Hypertension 11/04/2008     a. 11/97-Echo:EF 60%, mild LVH, diastolic dysfunction     ? Hyperlipidemia 11/04/2008     Hypercholesterolemia           a. 10/01-Total 158, Trig 127, HDL 51, LDL 82-Lipitor 20mg      ? IBS (irritable bowel syndrome) 11/04/2008   ? 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 Dizziness 11/04/2008 Review of Systems   Constitutional: Positive for malaise/fatigue.   HENT: Negative.    Eyes: Negative.    Cardiovascular: Negative.    Respiratory: Negative.    Endocrine: Negative.    Hematologic/Lymphatic: Negative.    Skin: Negative.    Musculoskeletal: Negative.    Gastrointestinal: Negative.    Genitourinary: Negative.    Neurological: Negative.    Psychiatric/Behavioral: Negative.    Allergic/Immunologic: Negative.        Physical Exam  HEENT: No abnormalities of the visible oro-nasopharynx, conjunctiva or sclera are noted. ?  NECK: There is no jugular venous distension. Carotids are palpable and without bruits. There is no thyroid enlargement. ?  Chest: Lung fields are clear to auscultation. There are no wheezes or crackles. ?  CV: There is a regular rhythm. The first and second heart sounds are normal. A grade 2-3/6 systolic ejection murmur is heard best with the patient supine.There are no diastolic murmurs, gallops or rubs. Her apical heart rate is 68?BPM.  ABD: The abdomen is soft and supple with normal bowel sounds. There is no hepatosplenomegaly, ascites, tenderness, masses or bruits. ?  Neuro: There are no focal motor defects. Ambulation is normal. Cognitive function appears normal. ?  Ext:?There is no edema or evidence of deep vein thrombosis. Peripheral pulses are satisfactory. ?  SKIN:?No rashes or cellulitis.  PSYCH:?The patient is calm, rationale and oriented.    Cardiovascular Studies  A twelve-lead ECG from 03/22/2021 reveals normal sinus rhythm with a heart rate of 57 bpm.  Left bundle branch block is noted.  Labs from 03/22/2021 revealed hemoglobin 14.6 g/dL.  Her troponin I was 0.032 with upper limits normal of 0.013.  Her potassium was 3.0 and serum creatinine was 0.97 mg/dL.    Echo Doppler study 03/23/2021:  Normal to hyperdynamic left ventricular contractility.  Visually estimated LVEF 65-70%.  Concentric LV remodeling.  Proximal basal septal hypertrophy.  Mild right ventricular dilatation with normal right ventricular contractility.  Mild left atrial dilatation.  Normal atrial dimensions.  Normal central venous pressure.  Moderate mitral annular calcification with normal mitral ventricle leaflet motion.  Mild mitral regurgitation.  Normal tricuspid valve motion.  Mild tricuspid regurgitation.  Estimated PA pressure 49 mmHg.  Aortic valve sclerosis and aortic valve cusp focal thickening without aortic stenosis or aortic regurgitation.  The pulmonic valve and pulmonary artery are not well visualized.  Trace pulmonic regurgitation.  Normal aortic root and proximal ascending aortic dimensions.  The transverse tear is not visualized.  No pericardial effusion.    Cardiovascular Health Factors  Vitals BP Readings from Last 3 Encounters:   03/31/21 128/78   03/23/21 122/50   11/11/20 100/60     Wt Readings from Last 3 Encounters:   03/31/21 69.4 kg (153 lb)   03/23/21 71.7 kg (158 lb)   11/11/20 69.4 kg (153 lb)  BMI Readings from Last 3 Encounters:   03/31/21 24.69 kg/m?   03/23/21 25.50 kg/m?   11/11/20 24.69 kg/m?      Smoking Social History     Tobacco Use   Smoking Status Never Smoker   Smokeless Tobacco Never Used      Lipid Profile Cholesterol   Date Value Ref Range Status   01/20/2021 137  Final     HDL   Date Value Ref Range Status   01/20/2021 43  Final     LDL   Date Value Ref Range Status   01/20/2021 75  Final     Triglycerides   Date Value Ref Range Status   01/20/2021 95  Final      Blood Sugar Hemoglobin A1C   Date Value Ref Range Status   01/09/2020 5.1  Final     Glucose   Date Value Ref Range Status   03/23/2021 97  Final   07/14/2020 96  Final   01/09/2020 101  Final   12/07/2004 143 (H) 70 - 110 MG/DL Final   69/62/9528 72 70 - 110 MG/DL Final   41/32/4401 71 70 - 110 MG/DL Final     Glucose, POC   Date Value Ref Range Status   03/23/2012 121 (H) 70 - 100 MG/DL Final   02/72/5366 440 (H) 70 - 100 MG/DL Final   34/74/2595 92 70 - 100 MG/DL Final          Problems Addressed Today  Urinary tract infection.  Hypertension.  Assessment and Plan     Erika Payne is currently doing well.  The minimal elevation in her serum troponin was most likely related to demand ischemia/injury related to her urinary tract infection and not to a coronary event.  She does not want to pursue stress testing.  She did want to stop her hydrochlorothiazide to see if that could be contributing to some of her fatigue.  We also discussed a sleep study but she did not want to be referred for sleep study at this time. Regular mild aerobic exercise, weight loss and adherence to a heart healthy diet were recommended.  I have asked her to notify her primary care physician at the first sign of any recurrent urinary tract infection to facilitate early treatment.  The patient reports no angina or congestive symptoms and her blood pressure is currently well controlled.  I have asked her to return for follow-up in 6 months time. The total time spent during this interview and exam was 30 minutes.         Current Medications (including today's revisions)  ? aspirin EC 81 mg tablet Take 81 mg by mouth daily.   ? atorvastatin (LIPITOR) 40 mg tablet Take one tablet by mouth daily.   ? ergocalciferol (vitamin D2) (VITAMIN D PO) Take 1 tablet by mouth daily.   ? hydroCHLOROthiazide (HYDRODIURIL) 50 mg tablet Take 1/2 (one-half) tablet by mouth once daily   ? levothyroxine (SYNTHROID) 100 mcg tablet Take 100 mcg by mouth daily.   ? LUMIGAN 0.01 % drop Apply 1 drop to left eye as directed at bedtime daily.   ? pantoprazole DR (PROTONIX) 40 mg tablet Take 40 mg by mouth daily.   ? spironolactone (ALDACTONE) 25 mg tablet Take 1 tablet by mouth once daily with food   ? temazepam (RESTORIL) 15 mg capsule Take 15-30 mg by mouth at bedtime as needed.   ? traMADol/acetaminophen(+) (ULTRACET) 37.5/325 mg tablet Take 1-2 Tabs by mouth  every 4 hours as needed for Pain.   ? VIT C/VIT E AC/LUT/COPPER/ZINC (PRESERVISION LUTEIN PO) Take 2 tablets by mouth daily.   ? vitamins, multiple cap Take 1 capsule by mouth daily.

## 2021-06-10 ENCOUNTER — Encounter: Admit: 2021-06-10 | Discharge: 2021-06-10 | Payer: MEDICARE | Primary: Family

## 2021-06-10 MED ORDER — HYDROCHLOROTHIAZIDE 50 MG PO TAB
ORAL_TABLET | Freq: Every day | ORAL | 3 refills | 28.00000 days | Status: AC
Start: 2021-06-10 — End: ?

## 2021-07-26 ENCOUNTER — Encounter: Admit: 2021-07-26 | Discharge: 2021-07-26 | Payer: MEDICARE | Primary: Family

## 2021-07-26 MED ORDER — SPIRONOLACTONE 25 MG PO TAB
ORAL_TABLET | ORAL | 3 refills | 90.00000 days | Status: AC
Start: 2021-07-26 — End: ?

## 2021-08-23 ENCOUNTER — Encounter: Admit: 2021-08-23 | Discharge: 2021-08-23 | Payer: MEDICARE | Primary: Family

## 2021-08-23 NOTE — Telephone Encounter
Received msg on nurse line from pt stating she used to take a medication for her BP as prescribed by Dr. Dedra Skeens ([facility]). She stated it was discontinued back in 2022 and she has been doing well until recently when she had some dental work and then saw another provider for something she couldn't recall and now a 3rd provider for what sounded like TIAs. She states with all of these developments her BP has been "getting high" and she wanted to know if she could restart the 2.5mg  dose (she did not name the medication she was referring to). She asks for call back to cell phone to further discuss.     On chart review, it appears pt was to stop her HCTZ at last OV with [facility] on 03/31/21 due to concerns about this medication possibly causing fatigue, however I see it was recently refilled in January of this year. It also appears pt was taking amlodipine in the past at 2.5mg  daily, but it was dc'd 11/11/20 for hypotension.     Attempted to reach pt in f/u on her cell phone but was directed to voicemail. Left msg asking to clarify if pt was requesting to restart the amlodipine. Also asked if she has a home BP cuff and if she is monitoring her BP and how high are the readings she has been getting if so. Asked pt to call back to [facility] nurse line for further discussion and apologized for phone tag. Left cardiology [facility] nurse line # for call back.

## 2021-09-09 ENCOUNTER — Encounter: Admit: 2021-09-09 | Discharge: 2021-09-09 | Payer: MEDICARE | Primary: Family

## 2021-09-09 NOTE — Telephone Encounter
Patient states she picked up some refills from her pharmacy and included in that was HCTZ. She thought she wasn't suppose to be taking that medication any longer. Upon chart review the HCTZ was Dc'd by Dr. Arna Medici in November 2022 at an office visit. It was not removed from the patient's medication list at that time. Spoke with patient's son to explain. He verbalizes understanding and will discuss with his mother. Medication removed from her med list at this time.

## 2021-10-17 IMAGING — CR [ID]
3 series · 3 of 3 positions shown · non-contrast
Comparison: none

[knee ap]
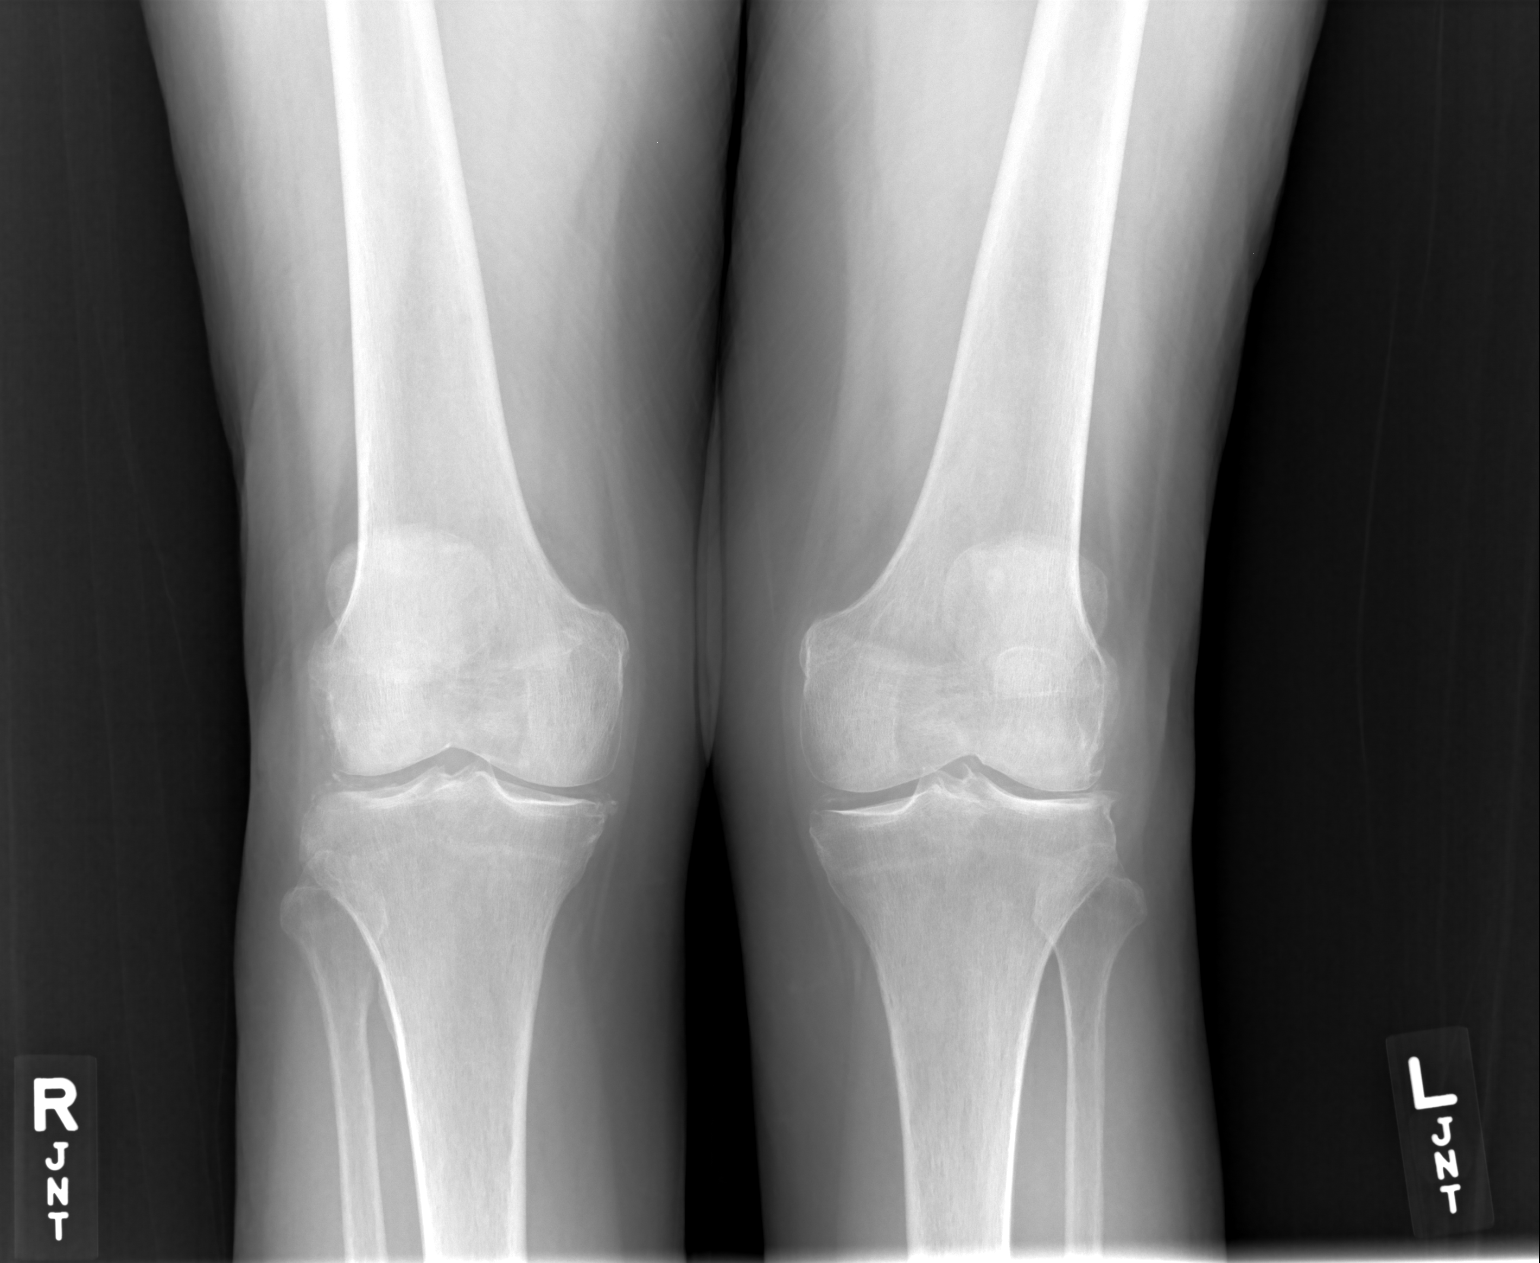

[knee sunrise]
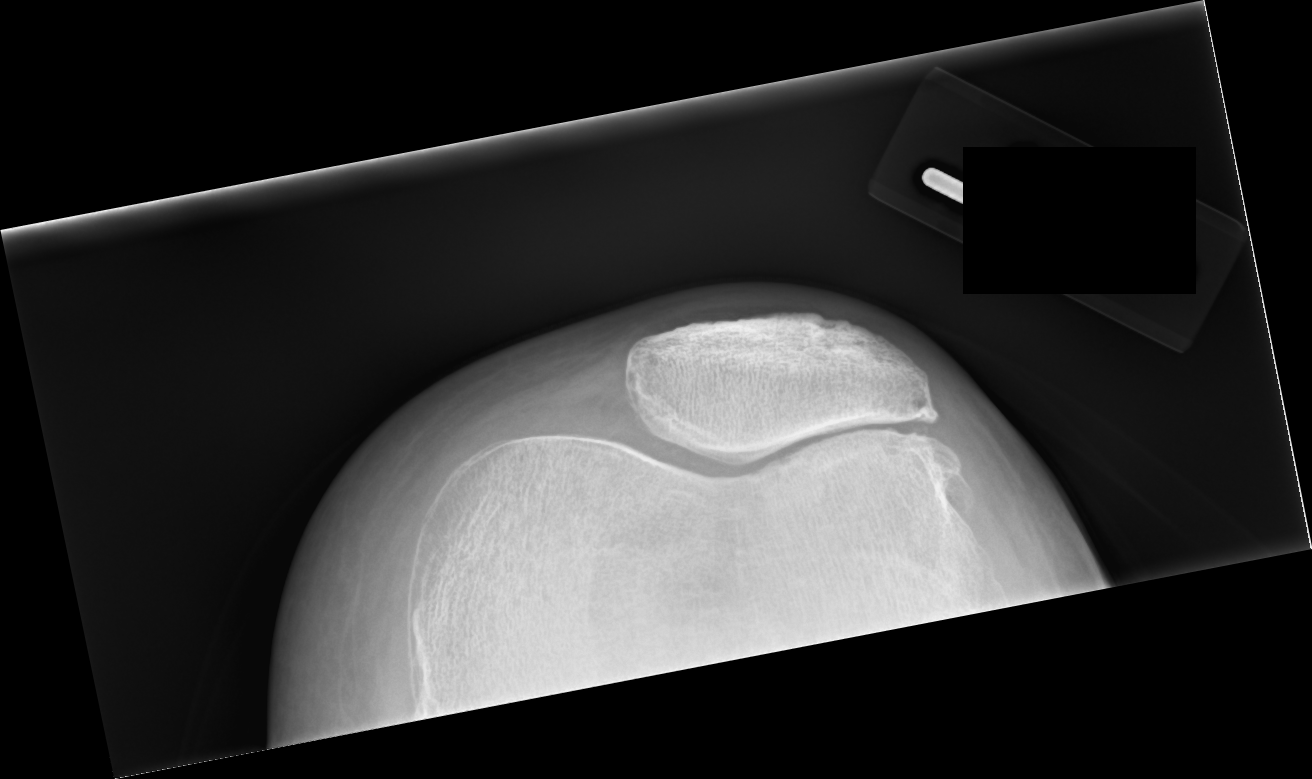

[knee lat]
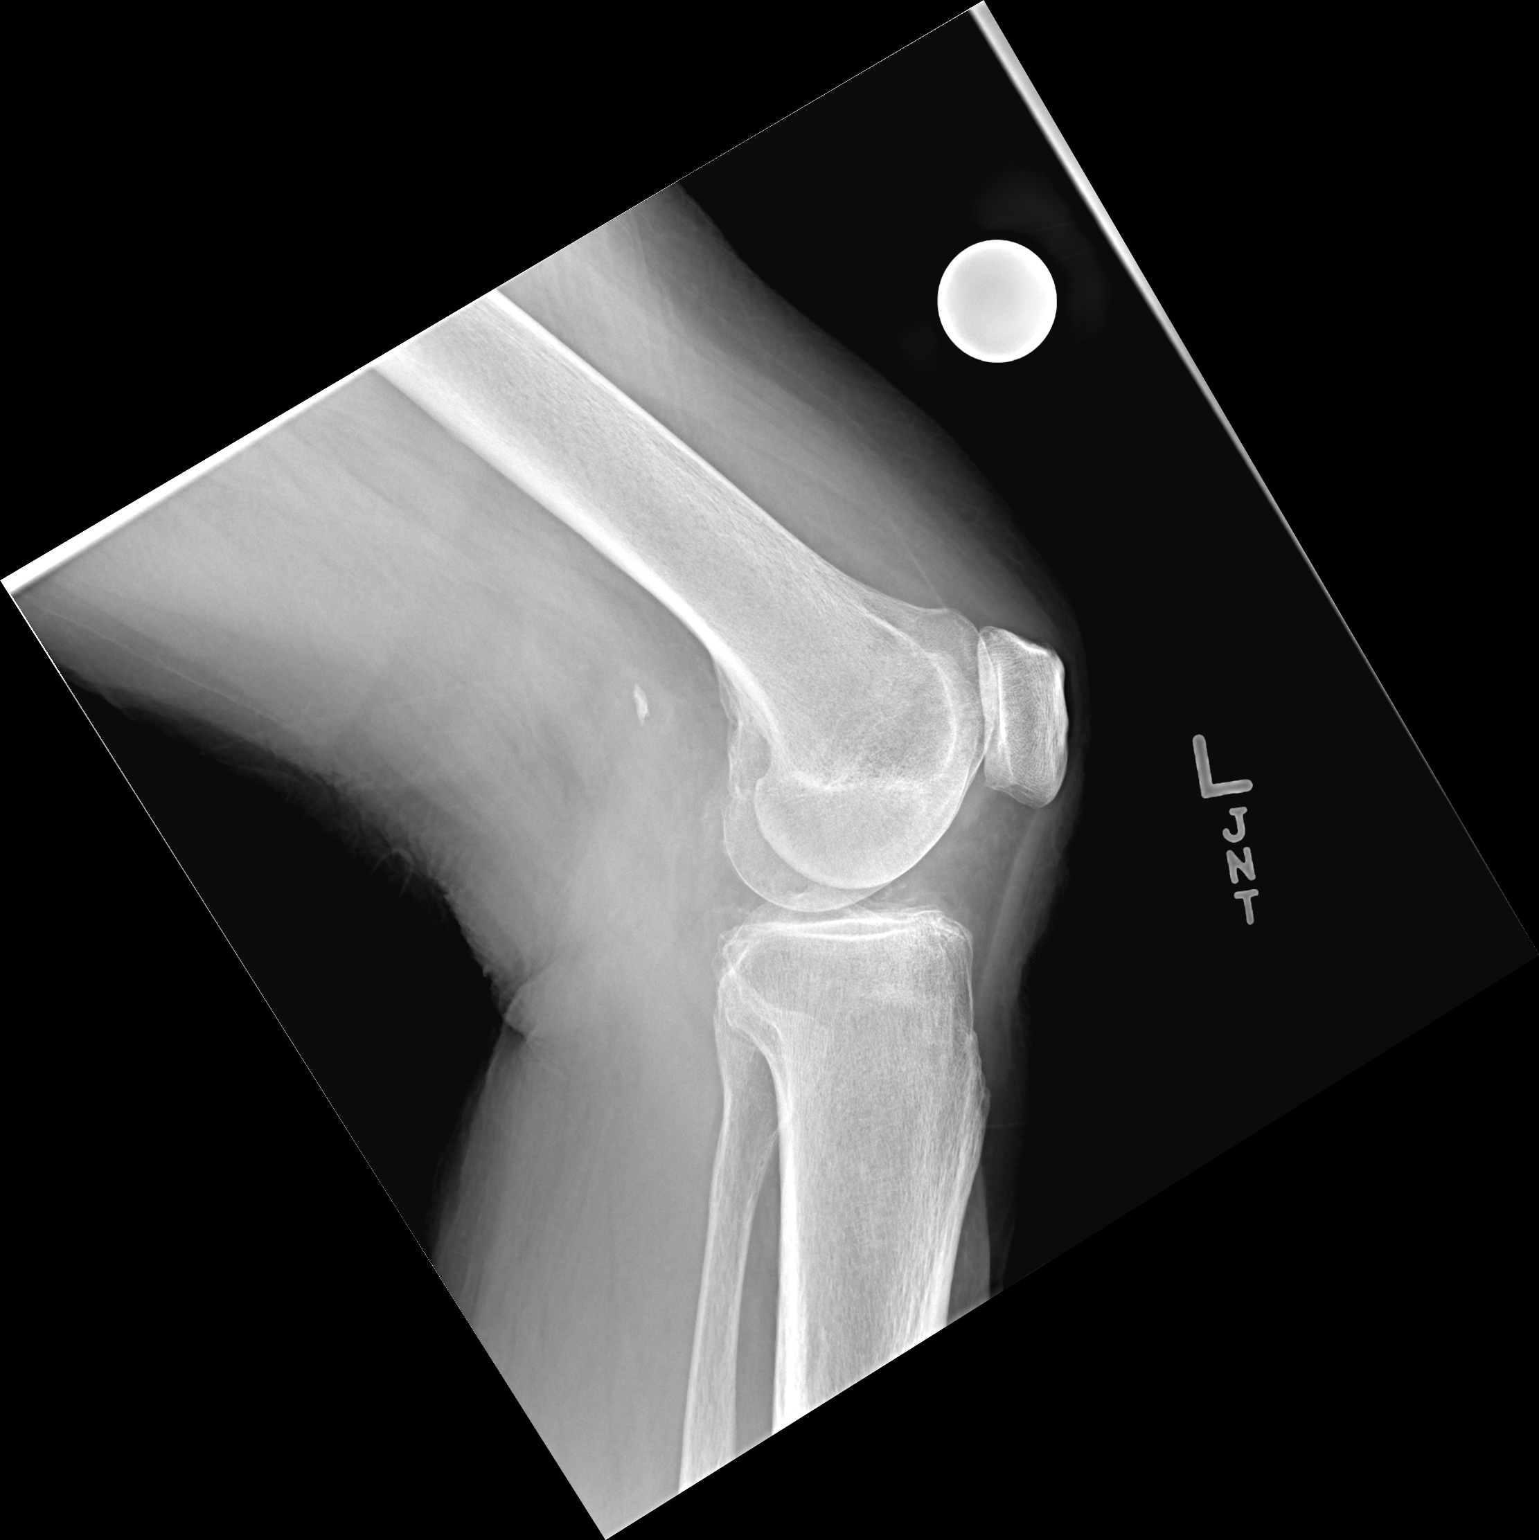

[3 of 3 positions shown; findings below may reference images not displayed]

DIAGNOSTIC STUDIES

EXAM

XR knee LT 3V

INDICATION

left knee pain
BILATERAL CHRONIC KNEE PAIN. NO KNOWN INJURY

TECHNIQUE

AP lateral patellar views

COMPARISONS

October 12, 2020

FINDINGS

Moderate marked lateral compartment narrowing is seen. There is chondrocalcinosis of the medial
lateral menisci. There is spurring of the lateral patellofemoral joint space and lateral
compartment. No fractures are evident.

IMPRESSION

Degenerative changes as described with minimal chondrocalcinosis of the menisci.

Tech Notes:

BILATERAL CHRONIC KNEE PAIN. NO KNOWN INJURY

## 2022-01-12 ENCOUNTER — Encounter: Admit: 2022-01-12 | Discharge: 2022-01-12 | Payer: MEDICARE | Primary: Family

## 2022-01-12 DIAGNOSIS — E785 Hyperlipidemia, unspecified: Secondary | ICD-10-CM

## 2022-01-12 DIAGNOSIS — I10 Essential (primary) hypertension: Secondary | ICD-10-CM

## 2022-01-12 DIAGNOSIS — I447 Left bundle-branch block, unspecified: Secondary | ICD-10-CM

## 2022-01-12 MED ORDER — ATORVASTATIN 40 MG PO TAB
40 mg | ORAL_TABLET | Freq: Every day | ORAL | 3 refills | Status: AC
Start: 2022-01-12 — End: ?

## 2022-03-20 ENCOUNTER — Encounter: Admit: 2022-03-20 | Discharge: 2022-03-20 | Payer: MEDICARE | Primary: Family

## 2022-03-21 ENCOUNTER — Encounter: Admit: 2022-03-21 | Discharge: 2022-03-21 | Payer: MEDICARE

## 2022-03-21 DIAGNOSIS — I2510 Atherosclerotic heart disease of native coronary artery without angina pectoris: Secondary | ICD-10-CM

## 2022-03-21 DIAGNOSIS — K8000 Calculus of gallbladder with acute cholecystitis without obstruction: Secondary | ICD-10-CM

## 2022-03-21 DIAGNOSIS — E039 Hypothyroidism, unspecified: Secondary | ICD-10-CM

## 2022-03-21 DIAGNOSIS — I10 Essential (primary) hypertension: Secondary | ICD-10-CM

## 2022-03-21 DIAGNOSIS — Z136 Encounter for screening for cardiovascular disorders: Secondary | ICD-10-CM

## 2022-03-21 DIAGNOSIS — E079 Disorder of thyroid, unspecified: Secondary | ICD-10-CM

## 2022-03-21 DIAGNOSIS — M549 Dorsalgia, unspecified: Secondary | ICD-10-CM

## 2022-03-21 DIAGNOSIS — K219 Gastro-esophageal reflux disease without esophagitis: Secondary | ICD-10-CM

## 2022-03-21 DIAGNOSIS — M4310 Spondylolisthesis, site unspecified: Secondary | ICD-10-CM

## 2022-03-21 DIAGNOSIS — K922 Gastrointestinal hemorrhage, unspecified: Secondary | ICD-10-CM

## 2022-03-21 DIAGNOSIS — E785 Hyperlipidemia, unspecified: Secondary | ICD-10-CM

## 2022-03-21 NOTE — Patient Instructions
Thank you for visiting our office today.    We would like to make the following medication adjustments:  NONE      Otherwise continue the same medications as you have been doing.          We will be pursuing the following tests after your appointment today:       Orders Placed This Encounter    ECG 12-LEAD         We will plan to see you back in 12 months.  Please call us in the meantime with any questions or concerns.        Please allow 5-7 business days for our providers to review your results. All normal results will go to MyChart. If you do not have Mychart, it is strongly recommended to get this so you can easily view all your results. If you do not have mychart, we will attempt to call you once with normal lab and testing results. If we cannot reach you by phone with normal results, we will send you a letter.  If you have not heard the results of your testing after one week please give us a call.       Your Cardiovascular Medicine [place] Team (Steve, Lisa, Jamie, Melanie, and Manie Bealer)  phone number is [phone].

## 2022-03-21 NOTE — Progress Notes
Date of Service: 03/21/2022    Erika Payne is a 81 y.o. female.       HPI    Erika Payne is followed for hypertension and hyperlipidemia and for chronic left bundle branch block on her ECG.? Her greatest problem over the past year has been limited vision.  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  She has been having visual hallucinations associated with her limited vision and carries a diagnosis of Charles Bonnet syndrome.  Her visual hallucinations consist of seeing people such as her husband who has passed away.  Most recently she has been spending 3 days with her son and then alternating 3 days with her daughter. ?Otherwise, over the past 6?months?she has been stable from a cardiovascular perspective. The patient reports no angina, congestive symptoms, palpitations, sensation of sustained forceful heart pounding, lightheadedness or syncope. Her exercise tolerance has been stable.???She?performs video?exercise (Heart and?Soul) every day?for 30 minutes?walks half a mile twice a day?for exercise?when the weather is nice.?The patient reports no myalgias, bleeding abnormalities,?or strokelike symptoms.  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   ?  Histroically, Erika. Word was admitted on 03/15/12 following a 2 day history of weakness, abdominal discomfort, nausea, and dyspnea. She was hypotensive in the ED, passed a melanotic stool, and was found to be anemic. During stay at [facility], received a total of 3 units of blood. She was transferred to [facility] and EGD was performed on 03/18/12 revealing minimal fresh blood in (the duodenal) bulb. A circumferential ulcer at D1/D2 junction with mild narrowing of the lumen. The ulcer was carefully examined and was found to be clean base with no high risk stigmata for recurrent bleeding were seen. No endoscopic intervention was performed. Erika. Payne was placed on pantoprazole twice daily and was discharged on 03/24/12.? She fell down the stairs on 07/25/14 and was hospitalized on the trauma service at [facility] from 07/25/14 through 07/28/14 where she was monitored for a subdural hemorrhage and hematoma. Intervention was not required and she recovered well.         Vitals:    03/21/22 1451   BP: 138/68   BP Source: Arm, Left Upper   Pulse: 62   SpO2: 95%   O2 Device: None (Room air)   PainSc: Zero   Weight: 71.9 kg (158 lb 9.6 oz)   Height: 167.6 cm (5' 6)     Body mass index is 25.6 kg/m?[name]     Past Medical History  Patient Active Problem List    Diagnosis Date Noted   ? SDH (subdural hematoma) (HCC) 07/26/2014   ? Fall (on) (from) other stairs and steps, initial encounter 07/26/2014   ? Syncope 07/25/2014   ? Left bundle branch block 03/28/2012   ? Hypokalemia 03/20/2012   ? 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 Acute blood loss anemia 03/20/2012   ? Metabolic acidosis 03/20/2012   ? GI bleed 03/17/2012   ? Observation for suspected coronary artery disease (CAD) 11/04/2008     Cardiac surveillance testing           a. 11/02 - Exercise echo doppler: EF 60%, trace-mild MR and TR, >17mm ST               segment depression in the infero-lateral leads, that became upsloping within  a minute into recovery     ? Hypertension 11/04/2008     a. 11/97-Echo:EF 60%, mild LVH, diastolic dysfunction     ? Hyperlipidemia 11/04/2008     Hypercholesterolemia           a. 10/01-Total 158, Trig 127, HDL 51, LDL 82-Lipitor 20mg      ? IBS (irritable bowel syndrome) 11/04/2008   ? 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 Dizziness 11/04/2008         Review of Systems   Constitutional: Negative.   HENT: Negative.    Eyes: Negative.    Cardiovascular: Negative.    Respiratory: Negative. Endocrine: Negative.    Hematologic/Lymphatic: Negative.    Skin: Negative.    Musculoskeletal: Negative.    Gastrointestinal: Negative.    Genitourinary: Negative.    Neurological: Negative.    Psychiatric/Behavioral: Negative.    Allergic/Immunologic: Negative.        Physical Exam  HEENT: Very limited vision bilaterally.  NECK: There is no jugular venous distension. Carotids are palpable and without bruits. There is no thyroid enlargement. ?  Chest: Lung fields are clear to auscultation. There are no wheezes or crackles. ?  CV: There is a regular rhythm. The first and second heart sounds are normal. A grade 2-3/6 systolic ejection murmur is heard best with the patient supine.There are no diastolic murmurs, gallops or rubs. Her apical heart rate is 68?BPM.  ABD: The abdomen is soft and supple with normal bowel sounds. There is no hepatosplenomegaly, ascites, tenderness, masses or bruits. ?  Neuro: There are no focal motor defects. Ambulation is normal. Cognitive function appears normal.  Her conversation is appropriate and her memory is good today.  Ext:?There is no edema or evidence of deep vein thrombosis. Peripheral pulses are satisfactory. ?  SKIN:?No rashes or cellulitis.  PSYCH:?The patient is calm, rationale and oriented.    Cardiovascular Studies  A twelve-lead ECG obtained on 03/21/2022 reveals sinus bradycardia with a heart rate of 54 bpm.  Left bundle branch block is noted.  Echo Doppler study 03/23/2021:  Normal to hyperdynamic left ventricular contractility. ?Visually estimated LVEF 65-70%.  Concentric LV remodeling. ?Proximal basal septal hypertrophy.  Mild right ventricular dilatation with normal right ventricular contractility.  Mild left atrial dilatation. ?Normal atrial dimensions. ?Normal central venous pressure.  Moderate mitral annular calcification with normal mitral ventricle leaflet motion. ?Mild mitral regurgitation.  Normal tricuspid valve motion. ?Mild tricuspid regurgitation. ?Estimated PA pressure 49 mmHg.  Aortic valve sclerosis and aortic valve cusp focal thickening without aortic stenosis or aortic regurgitation.  The pulmonic valve and pulmonary artery are not well visualized. ?Trace pulmonic regurgitation.  Normal aortic root and proximal ascending aortic dimensions. ?The transverse tear is not visualized.  No pericardial effusion.    Cardiovascular Health Factors  Vitals BP Readings from Last 3 Encounters:   03/21/22 138/68   03/31/21 128/78   03/23/21 122/50     Wt Readings from Last 3 Encounters:   03/21/22 71.9 kg (158 lb 9.6 oz)   03/31/21 69.4 kg (153 lb)   03/23/21 71.7 kg (158 lb)     BMI Readings from Last 3 Encounters:   03/21/22 25.60 kg/m?   03/31/21 24.69 kg/m?   03/23/21 25.50 kg/m?      Smoking Social History     Tobacco Use   Smoking Status Never   Smokeless Tobacco Never      Lipid Profile Cholesterol  Date Value Ref Range Status   01/20/2021 137  Final     HDL   Date Value Ref Range Status   01/20/2021 43  Final     LDL   Date Value Ref Range Status   01/20/2021 75  Final     Triglycerides   Date Value Ref Range Status   01/20/2021 95  Final      Blood Sugar Hemoglobin A1C   Date Value Ref Range Status   01/09/2020 5.1  Final     Glucose   Date Value Ref Range Status   01/27/2022 123 (H) 70 - 105 Final   03/23/2021 97  Final   07/14/2020 96  Final   12/07/2004 143 (H) 70 - 110 MG/DL Final   16/02/9603 72 70 - 110 MG/DL Final   54/01/8118 71 70 - 110 MG/DL Final     Glucose, POC   Date Value Ref Range Status   03/23/2012 121 (H) 70 - 100 MG/DL Final   14/78/2956 213 (H) 70 - 100 MG/DL Final   08/65/7846 92 70 - 100 MG/DL Final          Problems Addressed Today  Encounter Diagnoses   Name Primary?   ? Screening for heart disease Yes       Assessment and Plan     Erika. Megna appears stable from a cardiovascular perspective and reports no angina or congestive symptoms.  Her blood pressure appears reasonably well controlled.  She is tolerating statin therapy without difficulty.  Most of her problems over the past year appear related to decreasing and very limited vision with infrequent visual hallucinations.  These visual hallucinations appear to be very infrequent and most of the time she is lucid and her memory is intact.  No changes were made in her medical regimen.  Fall precautions were reinforced.  I asked her to return for follow-up in approximately 1 years time. The total time spent during this interview and exam with preparation and chart review was 30 minutes.           Current Medications (including today's revisions)  ? aspirin EC 81 mg tablet Take one tablet by mouth daily.   ? atorvastatin (LIPITOR) 40 mg tablet Take 1 tablet by mouth once daily   ? brimonidine-timoloL (COMBIGAN) 0.2-0.5 % ophthalmic drop INSTILL 1 DROP INTO EACH EYE TWICE DAILY   ? CHOLEcalciferoL (vitamin D3) 125 mcg/mL (5,000 unit/mL) drop Take  by mouth.   ? ergocalciferol (vitamin D2) (VITAMIN D PO) Take 1 tablet by mouth daily.   ? famotidine (PEPCID) 40 mg tablet Take one tablet by mouth daily.   ? levothyroxine (SYNTHROID) 125 mcg tablet Take one tablet by mouth daily.   ? LUMIGAN 0.01 % drop Apply one drop to left eye as directed at bedtime daily.   ? mupirocin (BACTROBAN) 2 % topical ointment Apply  topically to affected area three times daily.   ? pantoprazole DR (PROTONIX) 40 mg tablet Take one tablet by mouth daily.   ? spironolactone (ALDACTONE) 25 mg tablet Take 1 tablet by mouth once daily with food   ? temazepam (RESTORIL) 15 mg capsule Take one capsule to two capsules by mouth at bedtime as needed.   ? traMADol/acetaminophen(+) (ULTRACET) 37.5/325 mg tablet Take one tablet to two tablets by mouth every 4 hours as needed for Pain.   ? VIT C/VIT E AC/LUT/COPPER/ZINC (PRESERVISION LUTEIN PO) Take 2 tablets by mouth daily.   ? vitamins, multiple cap Take  one capsule by mouth daily.

## 2022-06-12 ENCOUNTER — Encounter: Admit: 2022-06-12 | Discharge: 2022-06-12 | Payer: MEDICARE

## 2022-06-12 MED ORDER — SPIRONOLACTONE 25 MG PO TAB
ORAL_TABLET | ORAL | 0 refills | 90.00000 days | Status: AC
Start: 2022-06-12 — End: ?

## 2022-09-10 ENCOUNTER — Encounter: Admit: 2022-09-10 | Discharge: 2022-09-10 | Payer: MEDICARE

## 2022-09-10 MED ORDER — SPIRONOLACTONE 25 MG PO TAB
ORAL_TABLET | 0 refills
Start: 2022-09-10 — End: ?

## 2023-01-10 ENCOUNTER — Encounter: Admit: 2023-01-10 | Discharge: 2023-01-10 | Payer: MEDICARE

## 2023-01-11 ENCOUNTER — Ambulatory Visit: Admit: 2023-01-11 | Discharge: 2023-01-12 | Payer: MEDICARE

## 2023-01-11 ENCOUNTER — Encounter: Admit: 2023-01-11 | Discharge: 2023-01-11 | Payer: MEDICARE

## 2023-01-11 DIAGNOSIS — I10 Essential (primary) hypertension: Secondary | ICD-10-CM

## 2023-01-11 DIAGNOSIS — M549 Dorsalgia, unspecified: Secondary | ICD-10-CM

## 2023-01-11 DIAGNOSIS — E039 Hypothyroidism, unspecified: Secondary | ICD-10-CM

## 2023-01-11 DIAGNOSIS — E785 Hyperlipidemia, unspecified: Secondary | ICD-10-CM

## 2023-01-11 DIAGNOSIS — E079 Disorder of thyroid, unspecified: Secondary | ICD-10-CM

## 2023-01-11 DIAGNOSIS — I2510 Atherosclerotic heart disease of native coronary artery without angina pectoris: Secondary | ICD-10-CM

## 2023-01-11 DIAGNOSIS — K922 Gastrointestinal hemorrhage, unspecified: Secondary | ICD-10-CM

## 2023-01-11 DIAGNOSIS — K219 Gastro-esophageal reflux disease without esophagitis: Secondary | ICD-10-CM

## 2023-01-11 DIAGNOSIS — I447 Left bundle-branch block, unspecified: Secondary | ICD-10-CM

## 2023-01-11 DIAGNOSIS — M4310 Spondylolisthesis, site unspecified: Secondary | ICD-10-CM

## 2023-01-11 DIAGNOSIS — K8000 Calculus of gallbladder with acute cholecystitis without obstruction: Secondary | ICD-10-CM

## 2023-01-11 NOTE — Patient Instructions
Thank you for visiting our office today.    We would like to make the following medication adjustments:  NONE       Otherwise continue the same medications as you have been doing.          We will be pursuing the following tests after your appointment today:       Orders Placed This Encounter    BASIC METABOLIC PANEL    ALT (SGPT)    LIPID PROFILE     Fasting labs (10-12hrs) at your convenience    We will plan to see you back in 12 months.  Please call us in the meantime with any questions or concerns.        Please allow 5-7 business days for our providers to review your results. All normal results will go to MyChart. If you do not have Mychart, it is strongly recommended to get this so you can easily view all your results. If you do not have mychart, we will attempt to call you once with normal lab and testing results. If we cannot reach you by phone with normal results, we will send you a letter.  If you have not heard the results of your testing after one week please give [place] a call.       Your Cardiovascular Medicine [place] Team Brett Canales, Pilar Jarvis, Shawna Orleans, and Ashley)  phone number is [phone].

## 2023-01-11 NOTE — Progress Notes
Date of Service: 01/11/2023    Erika Payne is a 82 y.o. female.       HPI   Erika Payne is followed for hypertension and hyperlipidemia and for chronic left bundle branch block on her ECG.  She has now moved to assisted living at [facility].  They provide all of her meals for her and they have activities such as bingo which she participates in.  Her mental status has improved considerably.  Her greatest problem over the past year has been limited vision.  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  Her visual hallucinations consist of seeing people such as her husband who has passed away.  All of her hallucinations have resolved since she moved to [facility].  Her mental clarity has improved considerably.  She states that her blood pressure is 130 mmHg systolic or less when she checks it outside of the office.  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  She is preparing to consult with an orthopedic surgeon.  Otherwise, over the past 6 months she has been stable from a cardiovascular perspective. The patient reports no angina, congestive symptoms, palpitations, sensation of sustained forceful heart pounding, lightheadedness or syncope. Her exercise tolerance has been decreasing due to arthritis in her left knee.  Perhaps she can walk on 100 feet at a time.   The patient reports no myalgias, bleeding abnormalities, or strokelike symptoms.  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  She is also receiving injections for macular degeneration.      Histroically, Erika. Amos was admitted on 03/15/12 following a 2 day history of weakness, abdominal discomfort, nausea, and dyspnea. She was hypotensive in the ED, passed a melanotic stool, and was found to be anemic. During stay at [facility], received a total of 3 units of blood. She was transferred to [facility] and EGD was performed on 03/18/12 revealing minimal fresh blood in (the duodenal) bulb. A circumferential ulcer at D1/D2 junction with mild narrowing of the lumen. The ulcer was carefully examined and was found to be clean base with no high risk stigmata for recurrent bleeding were seen. No endoscopic intervention was performed. Erika Payne was placed on pantoprazole twice daily and was discharged on 03/24/12.  She fell down the stairs on 07/25/14 and was hospitalized on the trauma service at [facility] from 07/25/14 through 07/28/14 where she was monitored for a subdural hemorrhage and hematoma. Intervention was not required and she recovered well.       Vitals:    01/11/23 1021   BP: (!) 155/71   BP Source: Arm, Left Upper   Pulse: 60   SpO2: 97%   O2 Device: None (Room air)   PainSc: Zero   Weight: 73.9 kg (163 lb)   Height: 167.6 cm (5' 6)     Body mass index is 26.31 kg/m?Erika Payne     Past Medical History  Patient Active Problem List    Diagnosis Date Noted    SDH (subdural hematoma) (HCC) 07/26/2014    Fall (on) (from) other stairs and steps, initial encounter 07/26/2014    Syncope 07/25/2014    Left bundle branch block 03/28/2012    Hypokalemia 03/20/2012    Duodenal ulcer 03/20/2012  Acute blood loss anemia 03/20/2012    Metabolic acidosis 03/20/2012    GI bleed 03/17/2012    Observation for suspected coronary artery disease (CAD) 11/04/2008     Cardiac surveillance testing           a. 11/02 - Exercise echo doppler: EF 60%, trace-mild MR and TR, >53mm ST               segment depression in the infero-lateral leads, that became upsloping within               a minute into recovery      Hypertension 11/04/2008     a. 11/97-Echo:EF 60%, mild LVH, diastolic dysfunction      Hyperlipidemia 11/04/2008     Hypercholesterolemia           a. 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    Physical Exam  HEENT: Very limited vision bilaterally.  NECK: There is no jugular venous distension. Carotids are palpable and without bruits. There is no thyroid enlargement.    Chest: Lung fields are clear to auscultation. There are no wheezes or crackles.    CV: There is a regular rhythm. The first and second heart sounds are normal. A grade 2-3/6 systolic ejection murmur is heard best with the patient supine.There are no diastolic murmurs, gallops or rubs. Her apical heart rate is 68 BPM.  ABD: The abdomen is soft and supple with normal bowel sounds. There is no hepatosplenomegaly, ascites, tenderness, masses or bruits.    Neuro: There are no focal motor defects. Ambulation is normal. Cognitive function appears normal.  Her conversation is appropriate and her memory is good today.  Ext: There is no edema or evidence of deep vein thrombosis. Peripheral pulses are satisfactory.    SKIN: No rashes or cellulitis.  PSYCH: The patient is calm, rationale and oriented.    Cardiovascular Studies  A twelve-lead ECG obtained on 03/21/2022 reveals sinus bradycardia with a heart rate of 54 bpm.  Left bundle branch block is noted.    Echo Doppler study 03/23/2021:  Normal to hyperdynamic left ventricular contractility.  Visually estimated LVEF 65-70%.  Concentric LV remodeling.  Proximal basal septal hypertrophy.  Mild right ventricular dilatation with normal right ventricular contractility.  Mild left atrial dilatation.  Normal atrial dimensions.  Normal central venous pressure.  Moderate mitral annular calcification with normal mitral ventricle leaflet motion.  Mild mitral regurgitation.  Normal tricuspid valve motion.  Mild tricuspid regurgitation.  Estimated PA pressure 49 mmHg.  Aortic valve sclerosis and aortic valve cusp focal thickening without aortic stenosis or aortic regurgitation.  The pulmonic valve and pulmonary artery are not well visualized.  Trace pulmonic regurgitation.  Normal aortic root and proximal ascending aortic dimensions.  The transverse tear is not visualized.  No pericardial effusion.    Cardiovascular Health Factors  Vitals BP Readings from Last 3 Encounters:   01/11/23 (!) 155/71   03/21/22  138/68   03/31/21 128/78     Wt Readings from Last 3 Encounters:   01/11/23 73.9 kg (163 lb)   03/21/22 71.9 kg (158 lb 9.6 oz)   03/31/21 69.4 kg (153 lb)     BMI Readings from Last 3 Encounters:   01/11/23 26.31 kg/m?   03/21/22 25.60 kg/m?   03/31/21 24.69 kg/m?      Smoking Social History     Tobacco Use   Smoking Status Never   Smokeless Tobacco Never      Lipid Profile Cholesterol   Date Value Ref Range Status   01/20/2021 137  Final     HDL   Date Value Ref Range Status   01/20/2021 43  Final     LDL   Date Value Ref Range Status   01/20/2021 75  Final     Triglycerides   Date Value Ref Range Status   01/20/2021 95  Final      Blood Sugar Hemoglobin A1C   Date Value Ref Range Status   01/09/2020 5.1  Final     Glucose   Date Value Ref Range Status   01/27/2022 123 (H) 70 - 105 Final   03/23/2021 97  Final   07/14/2020 96  Final   12/07/2004 143 (H) 70 - 110 MG/DL Final   16/02/9603 72 70 - 110 MG/DL Final   54/01/8118 71 70 - 110 MG/DL Final     Glucose, POC   Date Value Ref Range Status   03/23/2012 121 (H) 70 - 100 MG/DL Final   14/78/2956 213 (H) 70 - 100 MG/DL Final   08/65/7846 92 70 - 100 MG/DL Final          Problems Addressed Today  Encounter Diagnoses   Name Primary?    Primary hypertension Yes    Hyperlipidemia, unspecified hyperlipidemia type     Left bundle branch block        Assessment and Plan     Erika. Aversa appears stable from a cardiovascular perspective and reports no angina or congestive symptoms.  Her blood pressure appears reasonably well controlled when she checks it outside of the office.  She is tolerating statin therapy without difficulty.  Her mental status is greatly improved when she moved to [facility].  She is probably getting additional contact from other members there and the additional social activity is probably helping her mental clarity.  No changes were made in her medical regimen.  Fall precautions were reinforced.  She was given a requisition to check her Chem-7, fasting lipid profile and ALT.  I asked her to return for follow-up in approximately 1 years time. The total time spent during this interview and exam with preparation and chart review was 30 minutes.          Current Medications (including today's revisions)   acetaminophen (TYLENOL EXTRA STRENGTH) 500 mg tablet Take one tablet by mouth every 6 hours as needed for Pain. Max of 4,000 mg of acetaminophen in 24 hours.    aspirin EC 81 mg tablet Take one tablet by mouth daily.    atorvastatin (LIPITOR) 40 mg tablet Take 1 tablet by mouth once daily    brimonidine-timoloL (COMBIGAN) 0.2-0.5 % ophthalmic drop INSTILL 1 DROP INTO EACH EYE TWICE DAILY    CHOLEcalciferoL (vitamin D3) 125 mcg/mL (5,000 unit/mL) drop Take  by mouth.    ergocalciferol (vitamin D2) (VITAMIN D PO) Take 1 tablet by mouth daily.    famotidine (PEPCID) 40  mg tablet Take one tablet by mouth as Needed.    levothyroxine (SYNTHROID) 125 mcg tablet Take one tablet by mouth daily.    LUMIGAN 0.01 % drop Apply one drop to left eye as directed at bedtime daily.    mupirocin (BACTROBAN) 2 % topical ointment Apply  topically to affected area three times daily.    pantoprazole DR (PROTONIX) 40 mg tablet Take one tablet by mouth daily.    SIMETHICONE PO Take 125 mg by mouth as Needed. spironolactone (ALDACTONE) 25 mg tablet Take 1 tablet by mouth once daily with food (Patient taking differently: two tablets daily.)    temazepam (RESTORIL) 15 mg capsule Take one capsule to two capsules by mouth at bedtime as needed.    traMADol/acetaminophen(+) (ULTRACET) 37.5/325 mg tablet Take one tablet to two tablets by mouth every 4 hours as needed for Pain.    VIT C/VIT E AC/LUT/COPPER/ZINC (PRESERVISION LUTEIN PO) Take 2 tablets by mouth daily.    vitamins, multiple cap Take one capsule by mouth daily.

## 2023-01-18 ENCOUNTER — Encounter: Admit: 2023-01-18 | Discharge: 2023-01-18 | Payer: MEDICARE

## 2023-01-25 ENCOUNTER — Encounter: Admit: 2023-01-25 | Discharge: 2023-01-25 | Payer: MEDICARE

## 2023-01-25 NOTE — Telephone Encounter
Left message with stress test instructions with patient and son including date, time, and location. Instructions included the following:    NO DECAF OR CAFFEINE 24 HOURS PRIOR TO TEST. Examples: coffee, tea, decaf coffee or tea, cola, chocolate.     DO NOT EAT OR DRINK THE MORNING OF YOUR TEST unless otherwise instructed. (You may have a couple sips of water.) If you are a diabetic, if insulin dependent: please take one third of your insulin with a light breakfast (two pieces of dry toast and a small juice). Bring insulin and medication with you to the test.     TAKE MORNING MEDICATIONS WITH A COUPLE SIPS OF WATER PRIOR TO TEST.     Hold all vitamins and supplements on the morning of your test.     Phone number provided for any additional questions or concerns.

## 2023-02-07 ENCOUNTER — Encounter: Admit: 2023-02-07 | Discharge: 2023-02-07 | Payer: MEDICARE

## 2023-02-07 NOTE — Telephone Encounter
Attempted to reach patient's son regarding [facility] Stress Test scheduled for Friday, 9/13. Patient's son did not answer so a voicemail was left with instructions. Instructed to arrive at the [facility] at the [facility] office at 9:45 for stress test. Instructed to have nothing to eat or drink after midnight on Thursday, 9/12. Instructed to avoid all caffeine products for 24 hours prior to the test. Instructed to take all normal medications with a sip of water the morning of the test. Instructed to wear loose, comfortable clothing, and to avoid placing any oils or lotions on the chest the morning of the test. Instructed to call nurses back at [phone] with any questions or concerns.

## 2023-02-09 ENCOUNTER — Ambulatory Visit: Admit: 2023-02-09 | Discharge: 2023-02-09 | Payer: MEDICARE

## 2023-02-09 ENCOUNTER — Encounter: Admit: 2023-02-09 | Discharge: 2023-02-09 | Payer: MEDICARE

## 2023-02-09 DIAGNOSIS — E785 Hyperlipidemia, unspecified: Secondary | ICD-10-CM

## 2023-02-09 DIAGNOSIS — I447 Left bundle-branch block, unspecified: Secondary | ICD-10-CM

## 2023-02-09 DIAGNOSIS — I10 Essential (primary) hypertension: Secondary | ICD-10-CM

## 2023-02-09 DIAGNOSIS — R55 Syncope and collapse: Secondary | ICD-10-CM

## 2023-02-09 MED ORDER — SODIUM CHLORIDE 0.9 % IV SOLP
250 mL | INTRAVENOUS | 0 refills | Status: AC | PRN
Start: 2023-02-09 — End: ?

## 2023-02-09 MED ORDER — AMINOPHYLLINE 25 MG/ML IV SOLN
50 mg | INTRAVENOUS | 0 refills | Status: AC | PRN
Start: 2023-02-09 — End: ?

## 2023-02-09 MED ORDER — NITROGLYCERIN 0.4 MG SL SUBL
.4 mg | SUBLINGUAL | 0 refills | Status: AC | PRN
Start: 2023-02-09 — End: ?

## 2023-02-09 MED ORDER — ALBUTEROL SULFATE 90 MCG/ACTUATION IN HFAA
2 | RESPIRATORY_TRACT | 0 refills | Status: AC | PRN
Start: 2023-02-09 — End: ?

## 2023-02-09 MED ORDER — RP DX TC-99M TETROFOSMIN MCI
24 | Freq: Once | INTRAVENOUS | 0 refills | Status: CP
Start: 2023-02-09 — End: ?

## 2023-02-09 MED ORDER — RP DX TC-99M TETROFOSMIN MCI
8 | Freq: Once | INTRAVENOUS | 0 refills | Status: CP
Start: 2023-02-09 — End: ?

## 2023-02-09 MED ORDER — EUCALYPTUS-MENTHOL MM LOZG
1 | Freq: Once | ORAL | 0 refills | Status: AC | PRN
Start: 2023-02-09 — End: ?

## 2023-02-09 MED ORDER — REGADENOSON 0.4 MG/5 ML IV SYRG
.4 mg | Freq: Once | INTRAVENOUS | 0 refills | Status: CP
Start: 2023-02-09 — End: ?

## 2023-02-15 ENCOUNTER — Encounter: Admit: 2023-02-15 | Discharge: 2023-02-15 | Payer: MEDICARE

## 2023-02-15 NOTE — Telephone Encounter
Results and recommendations called to patient's son, Susy Frizzle. He verbalized understanding and has no further questions or concerns. Addended office note faxed to Dr. Lianne Bushy office at [phone].

## 2023-02-15 NOTE — Telephone Encounter
-----   Message from Wesley Blas, MD sent at 02/14/2023  2:50 PM CDT -----  Erika Payne: Please give Erika Payne the results of her stress test.  An inferior defect is noted but the study is reported to be low risk and in the absence of angina or anginal equivalent I do not believe that coronary angiography is necessary.  I have placed an addendum on her clinic note from January 11, 2023 concluding her perioperative evaluation.  Please send to her surgical and anesthesia teams.  Thanks.  SBG

## 2023-02-19 ENCOUNTER — Encounter: Admit: 2023-02-19 | Discharge: 2023-02-19 | Payer: MEDICARE

## 2023-02-19 DIAGNOSIS — I10 Essential (primary) hypertension: Secondary | ICD-10-CM

## 2023-02-19 DIAGNOSIS — I447 Left bundle-branch block, unspecified: Secondary | ICD-10-CM

## 2023-02-19 DIAGNOSIS — E785 Hyperlipidemia, unspecified: Secondary | ICD-10-CM

## 2023-02-19 LAB — BASIC METABOLIC PANEL
ANION GAP: 11
BLD UREA NITROGEN: 11
CALCIUM: 10
CHLORIDE: 105
CO2: 25
CREATININE: 0.9
GLUCOSE,PANEL: 109 — ABNORMAL HIGH (ref 70–105)
POTASSIUM: 3.9
SODIUM: 141

## 2023-02-19 LAB — ALT (SGPT): ALT: 19

## 2023-02-19 LAB — LIPID PROFILE: CHOLESTEROL: 142

## 2023-12-24 ENCOUNTER — Encounter: Admit: 2023-12-24 | Discharge: 2023-12-24 | Payer: MEDICARE

## 2023-12-25 ENCOUNTER — Encounter: Admit: 2023-12-25 | Discharge: 2023-12-25 | Payer: MEDICARE

## 2023-12-25 ENCOUNTER — Ambulatory Visit: Admit: 2023-12-25 | Discharge: 2023-12-26 | Payer: MEDICARE

## 2023-12-25 DIAGNOSIS — Z136 Encounter for screening for cardiovascular disorders: Principal | ICD-10-CM

## 2023-12-25 NOTE — Progress Notes
 Date of Service: 12/25/2023    Erika Payne is a 83 y.o. female.       HPI   Erika Payne is followed for hypertension and hyperlipidemia and for chronic left bundle branch block on her ECG.  She reports having a urinary tract infection approximately 2 months ago that responded to antibiotics.  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  She still resides in assisted living at [facility].  They provide all of her meals for her and they have activities such as bingo which she participates in.  Her mental status continues to improve.  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  Currently she can watch television using her left eye and can read and participate in activities and games.  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  Her mental clarity has improved considerably.  She states that her blood pressure is 130 mmHg systolic or less when she checks it outside of the office.   Otherwise, over the past 6 months she has been stable from a cardiovascular perspective. The patient reports no angina, congestive symptoms, palpitations, sensation of sustained forceful heart pounding, lightheadedness or syncope. Her exercise tolerance continues to slowly but gradually improve following left knee arthroplasty.  She participates in an exercise class at [facility] for 30 minutes during the weekdays.   The patient reports no myalgias, bleeding abnormalities, or strokelike symptoms.  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  She is also receiving injections for macular degeneration.      Histroically, Erika Payne was admitted on 03/15/12 following a 2 day history of weakness, abdominal discomfort, nausea, and dyspnea. She was hypotensive in the ED, passed a melanotic stool, and was found to be anemic. During stay at [facility], received a total of 3 units of blood. She was transferred to [facility] and EGD was performed on 03/18/12 revealing minimal fresh blood in (the duodenal) bulb. A circumferential ulcer at D1/D2 junction with mild narrowing of the lumen. The ulcer was carefully examined and was found to be clean base with no high risk stigmata for recurrent bleeding were seen. No endoscopic intervention was performed. Erika Payne was placed on pantoprazole twice daily and was discharged on 03/24/12.  She fell down the stairs on 07/25/14 and was hospitalized on the trauma service at [facility] from 07/25/14 through 07/28/14 where she was monitored for a subdural hemorrhage and hematoma. Intervention was not required and she recovered well.       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 11/02 - Exercise echo doppler: EF 60%, trace-mild MR and TR, >78mm ST               segment depression in the infero-lateral leads, that became upsloping within               a minute into recovery Hypertension 11/04/2008     a. 11/97-Echo:EF 60%, mild LVH, diastolic dysfunction      Hyperlipidemia 11/04/2008     Hypercholesterolemia           a. 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   HENT: Negative.     Eyes: Negative.    Cardiovascular: Negative.    Respiratory: Negative.     Endocrine: Negative.    Hematologic/Lymphatic: Negative.    Skin: Negative.    Musculoskeletal: Negative.    Gastrointestinal: Negative.    Genitourinary: Negative.    Neurological: Negative.    Psychiatric/Behavioral: Negative.     Allergic/Immunologic: Negative.        Physical Exam  GENERAL: The patient is well developed, well nourished, resting comfortably and in no distress.   HEENT: Very limited if any vision in her right eye.  She reports stable vision in her left eye  NECK: There is no jugular venous distension. Carotids are palpable and without bruits. There is no thyroid enlargement.    Chest: Lung fields are clear to auscultation. There are no wheezes or crackles.    CV: There is a regular rhythm. The first and second heart sounds are normal. A grade 3/6 systolic ejection murmur is heard best with the patient supine.There are no diastolic murmurs, gallops or rubs. Her apical heart rate is 68 BPM.  ABD: The abdomen is soft and supple with normal bowel sounds. There is no hepatosplenomegaly, ascites, tenderness, masses or bruits.    Neuro: There are no focal motor defects. Ambulation is normal. Cognitive function appears normal.  Her conversation is appropriate and her memory is good today.  Ext: There is no edema or evidence of deep vein thrombosis. Peripheral pulses are satisfactory.    SKIN: No rashes or cellulitis.  PSYCH: The patient is calm, rationale and oriented.    Cardiovascular Studies  A twelve-lead ECG obtained on 12/25/2023 reveals normal sinus rhythm with a heart rate of 61 bpm.  Left bundle branch block is noted along with a premature ventricular beat.  Echo Doppler study 03/23/2021:  Normal to hyperdynamic left ventricular contractility.  Visually estimated LVEF 65-70%.  Concentric LV remodeling.  Proximal basal septal hypertrophy.  Mild right ventricular dilatation with normal right ventricular contractility.  Mild left atrial dilatation.  Normal atrial dimensions.  Normal central venous pressure.  Moderate mitral annular calcification with normal mitral ventricle leaflet motion.  Mild mitral regurgitation.  Normal tricuspid valve motion.  Mild tricuspid regurgitation.  Estimated PA pressure  49 mmHg.  Aortic valve sclerosis and aortic valve cusp focal thickening without aortic stenosis or aortic regurgitation.  The pulmonic valve and pulmonary artery are not well visualized.  Trace pulmonic regurgitation.  Normal aortic root and proximal ascending aortic dimensions.  The transverse tear is not visualized.  No pericardial effusion.    A regadenoson  thallium myocardial perfusion imaging stress study was performed on 02/09/2023 and revealed:  Scintigraphic (planar/tomographic): Tomographic images were reconstructed in three orthogonal views. There is small to moderate-sized, moderately intense mixed defect involving the mid to basal inferior segments.  The extent of myocardial involvement is around 17% quantitatively (qualitatively this appears less significant).  There is also hyperintense extracardiac radiotracer uptake affecting the mid inferior segment which is more prominent in the rest image.  Polar coordinate map identifies statistically significant perfusion abnormalities in the segments described above. Summed Stress Score:  11   , Summed Rest Score:  5.   Regional Wall Thickening and Motion Post Stress:   There is abnormal septal motion likely due to underlying left bundle branch block. Subtle hypokinesis of the basal inferior wall. Left Ventricular Ejection Fraction (post stress, in the resting state) =  71 %. Left Ventricular End Diastolic Volume: 53.99 mL   SUMMARY/OPINION:  This study is abnormal, however it is probably low risk. There is small to moderate-sized, moderately intense mixed defect involving the mid to basal inferior segment.  Qualitative review suggests the degree of defect is not as severe in extent and severity as the quantitative summed stress score. This may suggest some degree of previous infarction and peri-infarct ischemia in the right coronary artery distribution, however there is also a significant degree of artifact related to subdiaphragmatic radiotracer uptake which reduces the specificity of this finding.   Global left ventricular systolic function is normal EF of 71%. There are no high risk prognostic indicators present.  The pharmacologic ECG portion of the study is non-diagnostic. When compared with prior regadenoson  thallium MPI from 01/2012, the small to moderate-sized moderately intense defect in the inferior segments is more prominent today, but the basal inferior segment did appear to have reduced perfusion.  However, the prior study was of poor quality with even more significant subdiaphragmatic adjacent radiotracer uptake.  The left ventricular ejection fraction was 84% in prior study. In aggregate the current study is low risk in regards to predicted annual cardiovascular mortality rate.  Cardiovascular Health Factors  Vitals BP Readings from Last 3 Encounters:   12/25/23 131/79   01/11/23 (!) 155/71   03/21/22 138/68     Wt Readings from Last 3 Encounters:   12/25/23 75.1 kg (165 lb 9.6 oz)   02/09/23 71.7 kg (158 lb)   01/11/23 73.9 kg (163 lb)     BMI Readings from Last 3 Encounters:   12/25/23 26.73 kg/m?   02/09/23 25.50 kg/m?   01/11/23 26.31 kg/m?      Smoking Social History     Tobacco Use   Smoking Status Never   Smokeless Tobacco Never      Lipid Profile Cholesterol   Date Value Ref Range Status   02/19/2023 142  Final     HDL   Date Value Ref Range Status   02/19/2023 43  Final     LDL   Date Value Ref Range Status   02/19/2023 76  Final     Triglycerides   Date Value Ref Range Status   02/19/2023 117  Final      Blood Sugar  Hemoglobin A1C   Date Value Ref Range Status   01/09/2020 5.1  Final     Glucose   Date Value Ref Range Status   06/14/2023 110 (H) 70 - 105 Final   02/19/2023 109 (H) 70 - 105 Final   01/27/2022 123 (H) 70 - 105 Final     Glucose, POC   Date Value Ref Range Status   03/23/2012 121 (H) 70 - 100 MG/DL Final   89/74/7986 878 (H) 70 - 100 MG/DL Final   89/75/7986 92 70 - 100 MG/DL Final          Problems Addressed Today  Encounter Diagnoses   Name Primary?    Screening for heart disease Yes       Assessment and Plan     Erika Payne appears stable from a cardiovascular perspective.  She reports no angina or congestive symptoms.  She is tolerating statin therapy and her blood pressure has been well-controlled.  Her systolic murmur is getting gradually a little louder.  She did not want to obtain a repeat echo Doppler study but indicated that she might consider it next year.  Cardiovascular risk factor management was reviewed in detail.  I have asked her to return for follow-up in approximately 1 years time. The total time spent during this interview and exam with preparation and chart review was 30 minutes.         Current Medications (including today's revisions)   acetaminophen (TYLENOL EXTRA STRENGTH) 500 mg tablet Take one tablet by mouth every 6 hours as needed for Pain. Max of 4,000 mg of acetaminophen in 24 hours.    aspirin EC 81 mg tablet Take one tablet by mouth daily.    atorvastatin  (LIPITOR) 40 mg tablet Take 1 tablet by mouth once daily    brimonidine-timoloL (COMBIGAN) 0.2-0.5 % ophthalmic drop INSTILL 1 DROP INTO EACH EYE TWICE DAILY    CHOLEcalciferoL (vitamin D3) 125 mcg/mL (5,000 unit/mL) drop Take  by mouth. ergocalciferol (vitamin D2) (VITAMIN D PO) Take 1 tablet by mouth daily.    famotidine (PEPCID) 40 mg tablet Take one tablet by mouth as Needed. (Patient taking differently: Take one tablet by mouth daily.)    levothyroxine (SYNTHROID) 125 mcg tablet Take one tablet by mouth daily.    LUMIGAN 0.01 % drop Apply one drop to left eye as directed at bedtime daily.    mupirocin (BACTROBAN) 2 % topical ointment Apply  topically to affected area three times daily.    pantoprazole DR (PROTONIX) 40 mg tablet Take one tablet by mouth daily. (Patient not taking: Reported on 12/25/2023)    SIMETHICONE PO Take 125 mg by mouth as Needed. (Patient not taking: Reported on 12/25/2023)    spironolactone  (ALDACTONE ) 25 mg tablet Take 1 tablet by mouth once daily with food (Patient taking differently: Take two tablets by mouth daily.)    temazepam (RESTORIL) 15 mg capsule Take one capsule to two capsules by mouth at bedtime as needed. (Patient taking differently: Take one capsule to two capsules by mouth daily.)    traMADol/acetaminophen(+) (ULTRACET) 37.5/325 mg tablet Take one tablet to two tablets by mouth every 4 hours as needed for Pain. (Patient not taking: Reported on 12/25/2023)    VIT C/VIT E AC/LUT/COPPER/ZINC (PRESERVISION LUTEIN PO) Take 2 tablets by mouth daily.    vitamins, multiple cap Take one capsule by mouth daily.

## 2024-01-03 ENCOUNTER — Encounter: Admit: 2024-01-03 | Discharge: 2024-01-03 | Payer: MEDICARE

## 2024-01-03 DIAGNOSIS — R0609 Other forms of dyspnea: Secondary | ICD-10-CM

## 2024-01-03 DIAGNOSIS — I10 Essential (primary) hypertension: Principal | ICD-10-CM

## 2024-01-03 DIAGNOSIS — I447 Left bundle-branch block, unspecified: Secondary | ICD-10-CM

## 2024-01-03 DIAGNOSIS — R5383 Other fatigue: Secondary | ICD-10-CM

## 2024-01-03 DIAGNOSIS — R42 Dizziness and giddiness: Secondary | ICD-10-CM

## 2024-01-03 DIAGNOSIS — R55 Syncope and collapse: Secondary | ICD-10-CM

## 2024-01-03 NOTE — Telephone Encounter
-----   Message from GORMAN Alexander, MD sent at 01/02/2024  4:18 PM CDT -----  Regarding: RE: Echo  Sure.  Okay to order echo Doppler study.  Thanks.  SBG  ----- Message -----  From: Elaine Pierce, RN  Sent: 01/02/2024   3:26 PM CDT  To: Elspeth KATHEE Alexander, MD  Subject: Echo                                             Dr. Alexander,    Brenae's son Adina called in and they would like to go ahead and do the echo now if you still think that is a good idea.  He states that after the appointment, him and his sister talked to [place] and she has been feeling a little more fatigued and has had some occasional dizziness.  Please let me know if this is okay and I will submit the order and fax to [facility].    Thank you!  Jamie

## 2024-01-16 ENCOUNTER — Ambulatory Visit: Admit: 2024-01-16 | Discharge: 2024-01-16 | Payer: MEDICARE

## 2024-01-16 ENCOUNTER — Encounter: Admit: 2024-01-16 | Discharge: 2024-01-16 | Payer: MEDICARE
